# Patient Record
Sex: Female | Born: 2000 | Race: White | Hispanic: No | Marital: Single | Smoking: Never smoker
Health system: Southern US, Community
[De-identification: ages and names within clinical notes are randomized; demographics above are authoritative.]

## PROBLEM LIST (undated history)

## (undated) DIAGNOSIS — N83209 Unspecified ovarian cyst, unspecified side: Secondary | ICD-10-CM

## (undated) HISTORY — DX: Unspecified ovarian cyst, unspecified side: N83.209

---
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
## 2020-01-08 ENCOUNTER — Ambulatory Visit: Payer: Managed Care, Other (non HMO)

## 2020-01-10 ENCOUNTER — Other Ambulatory Visit: Payer: Self-pay

## 2020-01-10 ENCOUNTER — Telehealth: Payer: Self-pay | Admitting: Certified Nurse Midwife

## 2020-01-10 MED ORDER — MEDROXYPROGESTERONE ACETATE 150 MG/ML IM SUSP: 150.0000 mg | Freq: Once | INTRAMUSCULAR | 3 refills | Status: DC

## 2020-01-10 NOTE — Telephone Encounter (Signed)
Pt called in needs depo refill sent to [facility] in [place]. Please advise

## 2020-01-12 ENCOUNTER — Other Ambulatory Visit: Payer: Self-pay

## 2020-01-12 ENCOUNTER — Ambulatory Visit (INDEPENDENT_AMBULATORY_CARE_PROVIDER_SITE_OTHER): Payer: Managed Care, Other (non HMO) | Admitting: Certified Nurse Midwife

## 2020-01-12 DIAGNOSIS — Z3042 Encounter for surveillance of injectable contraceptive: Secondary | ICD-10-CM | POA: Diagnosis not present

## 2020-01-12 MED ORDER — MEDROXYPROGESTERONE ACETATE 150 MG/ML IM SUSP
150.0000 mg | Freq: Once | INTRAMUSCULAR | Status: AC
  Administered 2020-01-12: 150 mg via INTRAMUSCULAR

## 2020-01-12 NOTE — Progress Notes (Signed)
Date last pap: NA Last Depo-Provera: 10/16/2019 Side Effects if any: NA Serum HCG indicated? NA Depo-Provera 150 mg IM given by: [facility] CMA  Next appointment due July 16-July-30

## 2020-01-15 ENCOUNTER — Ambulatory Visit: Payer: Managed Care, Other (non HMO)

## 2020-01-19 ENCOUNTER — Ambulatory Visit: Payer: Managed Care, Other (non HMO)

## 2020-04-11 ENCOUNTER — Ambulatory Visit (INDEPENDENT_AMBULATORY_CARE_PROVIDER_SITE_OTHER): Payer: Managed Care, Other (non HMO)

## 2020-04-11 DIAGNOSIS — Z3042 Encounter for surveillance of injectable contraceptive: Secondary | ICD-10-CM

## 2020-04-11 MED ORDER — MEDROXYPROGESTERONE ACETATE 150 MG/ML IM SUSP
150.0000 mg | Freq: Once | INTRAMUSCULAR | Status: AC
  Administered 2020-04-11: 150 mg via INTRAMUSCULAR

## 2020-04-11 NOTE — Progress Notes (Signed)
Date last pap: underage. Last Depo-Provera: 01/12/2020 . Side Effects if any: none. Serum HCG indicated? n/a. Depo-Provera 150 mg IM given by: Jari Pigg, LPN Next appointment due Oct 14-28, 2021.

## 2020-05-28 ENCOUNTER — Encounter: Payer: Managed Care, Other (non HMO) | Admitting: Certified Nurse Midwife

## 2020-06-11 ENCOUNTER — Encounter: Payer: Managed Care, Other (non HMO) | Admitting: Certified Nurse Midwife

## 2020-06-15 ENCOUNTER — Encounter: Payer: Self-pay | Admitting: Emergency Medicine

## 2020-06-15 ENCOUNTER — Emergency Department
Admission: EM | Admit: 2020-06-15 | Discharge: 2020-06-15 | Disposition: A | Discharge status: Home / Self Care | Payer: Managed Care, Other (non HMO) | Attending: Emergency Medicine | Admitting: Emergency Medicine

## 2020-06-15 ENCOUNTER — Other Ambulatory Visit: Payer: Self-pay

## 2020-06-15 DIAGNOSIS — W460XXA Contact with hypodermic needle, initial encounter: Secondary | ICD-10-CM | POA: Diagnosis not present

## 2020-06-15 DIAGNOSIS — S61432A Puncture wound without foreign body of left hand, initial encounter: Secondary | ICD-10-CM | POA: Insufficient documentation

## 2020-06-15 LAB — BASIC METABOLIC PANEL
Anion gap: 11 (ref 5–15)
BUN: 10 mg/dL (ref 6–20)
CO2: 23 mmol/L (ref 22–32)
Calcium: 9.7 mg/dL (ref 8.9–10.3)
Chloride: 106 mmol/L (ref 98–111)
Creatinine, Ser: 0.79 mg/dL (ref 0.44–1.00)
GFR calc Af Amer: >60 mL/min (ref >60)
GFR calc non Af Amer: >60 mL/min (ref >60)
Glucose, Bld: 93 mg/dL (ref 70–99)
Potassium: 3.6 mmol/L (ref 3.5–5.1)
Sodium: 140 mmol/L (ref 135–145)

## 2020-06-15 LAB — POC URINE PREG, ED: Preg Test, Ur: NEGATIVE

## 2020-06-15 NOTE — ED Notes (Signed)
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

## 2020-06-15 NOTE — ED Notes (Addendum)
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

## 2020-06-15 NOTE — ED Triage Notes (Signed)
Pt here for needle stick.  Works at [employer] office and was stuck by needle that someone did not put a cap on.  Unsure which pt came from as was cleaning up at end of day.  Has cleaned with soap and water.  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

## 2020-06-15 NOTE — ED Provider Notes (Signed)
[facility] Emergency Department Provider Note  ____________________________________________   First MD Initiated Contact with Patient 06/15/20 2204     (approximate)  I have reviewed the triage vital signs and the nursing notes.   HISTORY  Chief Complaint Body Fluid Exposure   HPI Mackenzie Rice is a 19 y.o. female without significant past medical history who presents for assessment after an accidental occupational needlestick injury to her left second digit that occurred this afternoon.  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  She states it was immediately cleaned and there was no significant bleeding or blood on the needle.  Source patient is identified.  Patient denies any other acute symptoms or injuries.  No recent fevers, chills, cough, nausea, vomiting, diarrhea, dysuria, rash, extremity pain, vomiting, or other acute symptoms.  Patient states she thinks her hep B series is up-to-date but is not 100% sure.         History reviewed. No pertinent past medical history.  There are no problems to display for this patient.   History reviewed. No pertinent surgical history.  Prior to Admission medications   Medication Sig Start Date End Date Taking? Authorizing Provider  medroxyPROGESTERone (DEPO-PROVERA) 150 MG/ML injection Inject 1 mL (150 mg total) into the muscle once for 1 dose. 01/10/20 01/10/20  Doreene Burke, CNM    Allergies Patient has no known allergies.  Family History  Problem Relation Age of Onset   Cancer Paternal Aunt    Breast cancer Neg Hx    Ovarian cancer Neg Hx    Colon cancer Neg Hx     Social History Social History   Tobacco Use   Smoking status: Never Smoker   Smokeless tobacco: Never Used  Building services engineer Use: Never used  Substance Use Topics   Alcohol use: No   Drug use: No    Review of  Systems  Review of Systems  Constitutional: Negative for chills and fever.  HENT: Negative for sore throat.   Eyes: Negative for pain.  Respiratory: Negative for cough and stridor.   Cardiovascular: Negative for chest pain.  Gastrointestinal: Negative for vomiting.  Genitourinary: Negative for dysuria.  Musculoskeletal: Negative for myalgias.  Skin: Negative for rash.  Neurological: Negative for seizures, loss of consciousness and headaches.  Psychiatric/Behavioral: Negative for suicidal ideas.  All other systems reviewed and are negative.     ____________________________________________   PHYSICAL EXAM:  VITAL SIGNS: ED Triage Vitals  Enc Vitals Group     BP 06/15/20 1821 107/82     Pulse Rate 06/15/20 1821 100     Resp 06/15/20 1821 14     Temp 06/15/20 1821 98.5 F (36.9 C)     Temp Source 06/15/20 1821 Oral     SpO2 06/15/20 1821 100 %     Weight 06/15/20 1814 115 lb (52.2 kg)     Height 06/15/20 1814 5\' 6"  (1.676 m)     Head Circumference --      Peak Flow --      Pain Score 06/15/20 1814 0     Pain Loc --      Pain Edu? --      Excl. in GC? --    Vitals:   06/15/20 1821 06/15/20 2136  BP: 107/82 120/70  Pulse: 100 78  Resp: 14 15  Temp: 98.5 F (36.9 C) 98.3 F (  36.8 C)  SpO2: 100% 100%   Physical Exam Vitals and nursing note reviewed.  Constitutional:      General: She is not in acute distress.    Appearance: She is well-developed.  HENT:     Head: Normocephalic and atraumatic.     Right Ear: External ear normal.     Left Ear: External ear normal.     Nose: Nose normal.  Eyes:     Conjunctiva/sclera: Conjunctivae normal.  Cardiovascular:     Rate and Rhythm: Normal rate and regular rhythm.     Pulses: Normal pulses.     Heart sounds: No murmur heard.   Pulmonary:     Effort: Pulmonary effort is normal. No respiratory distress.  Abdominal:     Palpations: Abdomen is soft.     Tenderness: There is no abdominal tenderness.    Musculoskeletal:     Cervical back: Neck supple.  Skin:    General: Skin is warm and dry.     Capillary Refill: Capillary refill takes less than 2 seconds.  Neurological:     Mental Status: She is alert and oriented to person, place, and time.  Psychiatric:        Mood and Affect: Mood normal.     No puncture wound visualized in the left hand or any digit.  Patient is able to flex and extend all digits and left hand against resistance.  Sensation intact in the triage attribution of the radial ulnar and median nerves. ____________________________________________   LABS (all labs ordered are listed, but only abnormal results are displayed)  Labs Reviewed  BASIC METABOLIC PANEL  PREGNANCY, URINE  POC URINE PREG, ED   ____________________________________________  ____________________________________________   PROCEDURES  Procedure(s) performed (including Critical Care):  Procedures   ____________________________________________   INITIAL IMPRESSION / ASSESSMENT AND PLAN / ED COURSE        Patient presents with [place] to history exam after accidental Occupational needlestick injury.  Patient is afebrile hemodynamically stable arrival.  No significant injury noted on exam to finger reportedly injured.  HIV postexposure prophylaxis not indicated at this time given no source patient is identifiable.  We will send hepatitis panel.  Counseled patient to follow-up with her PCP and that she could get the results through her MyChart.  Discharge stable condition.  Strict return precautions advised and discussed.   ____________________________________________   FINAL CLINICAL IMPRESSION(S) / ED DIAGNOSES  Final diagnoses:  Accident caused by hypodermic needle, initial encounter    Medications - No data to display   ED Discharge Orders    None       Note:  This document was prepared using Dragon voice recognition software and may include unintentional dictation  errors.   Gilles Chiquito, MD 06/15/20 2312

## 2020-06-15 NOTE — ED Notes (Signed)
Lab called for add on 

## 2020-06-28 ENCOUNTER — Other Ambulatory Visit: Payer: Self-pay

## 2020-06-28 ENCOUNTER — Encounter: Payer: Self-pay | Admitting: Internal Medicine

## 2020-06-28 ENCOUNTER — Ambulatory Visit (INDEPENDENT_AMBULATORY_CARE_PROVIDER_SITE_OTHER): Payer: Managed Care, Other (non HMO) | Admitting: Internal Medicine

## 2020-06-28 VITALS — BP 116/72 | HR 74

## 2020-06-28 DIAGNOSIS — R6889 Other general symptoms and signs: Secondary | ICD-10-CM

## 2020-06-28 DIAGNOSIS — L819 Disorder of pigmentation, unspecified: Secondary | ICD-10-CM | POA: Diagnosis not present

## 2020-06-28 DIAGNOSIS — R238 Other skin changes: Secondary | ICD-10-CM

## 2020-06-28 DIAGNOSIS — R42 Dizziness and giddiness: Secondary | ICD-10-CM

## 2020-06-28 DIAGNOSIS — W460XXA Contact with hypodermic needle, initial encounter: Secondary | ICD-10-CM

## 2020-06-28 DIAGNOSIS — R233 Spontaneous ecchymoses: Secondary | ICD-10-CM

## 2020-06-28 NOTE — Progress Notes (Signed)
Subjective:    Patient ID: Mackenzie Rice, female    DOB: [birth year], 19 y.o.   MRN: [id number]  HPI  Pt presents to the clinic today for ER follow up. She went to the ER 10/2 after a needle stick injury of left index finger at work. She had cleaned the area immediately after the incident. Hep B, Hep C, and HIV were negative. She was discharged without intervention, advised to follow up with her PCP. Since discharge, she denies pain, redness, swelling or discharge.  She is concerned that she may be anemic. She reports intermittent dizziness if she stands up too quick or for too long, she reports cold intolerance and that her hands and feet turn blue, easy bruising and fatigue. She denies abnormal bleeding. Her mother has a history of thyroid disorder. She has no family history of Raynauds that she is aware of.   Review of Systems      No past medical history on file.  Current Outpatient Medications  Medication Sig Dispense Refill   medroxyPROGESTERone (DEPO-PROVERA) 150 MG/ML injection Inject 1 mL (150 mg total) into the muscle once for 1 dose. 1 mL 3   No current facility-administered medications for this visit.    No Known Allergies  Family History  Problem Relation Age of Onset   Cancer Paternal Aunt    Breast cancer Neg Hx    Ovarian cancer Neg Hx    Colon cancer Neg Hx     Social History   Socioeconomic History   Marital status: Single    Spouse name: Not on file   Number of children: Not on file   Years of education: Not on file   Highest education level: Not on file  Occupational History   Not on file  Tobacco Use   Smoking status: Never Smoker   Smokeless tobacco: Never Used  Vaping Use   Vaping Use: Never used  Substance and Sexual Activity   Alcohol use: No   Drug use: No   Sexual activity: Never    Birth control/protection: Injection  Other Topics Concern   Not on file  Social History Narrative   Not on file   Social  Determinants of Health   Financial Resource Strain:    Difficulty of Paying Living Expenses: Not on file  Food Insecurity:    Worried About Running Out of Food in the Last Year: Not on file   [employer] of Food in the Last Year: Not on file  Transportation Needs:    Lack of Transportation (Medical): Not on file   Lack of Transportation (Non-Medical): Not on file  Physical Activity:    Days of Exercise per Week: Not on file   Minutes of Exercise per Session: Not on file  Stress:    Feeling of Stress : Not on file  Social Connections:    Frequency of Communication with Friends and Family: Not on file   Frequency of Social Gatherings with Friends and Family: Not on file   Attends Religious Services: Not on file   Active Member of Clubs or Organizations: Not on file   Attends Banker Meetings: Not on file   Marital Status: Not on file  Intimate Partner Violence:    Fear of Current or Ex-Partner: Not on file   Emotionally Abused: Not on file   Physically Abused: Not on file   Sexually Abused: Not on file     Constitutional: Pt reports fatigue. Denies fever, malaise, headache or  abrupt weight changes.  Respiratory: Denies difficulty breathing, shortness of breath, cough or sputum production.   Cardiovascular: Pt reports blue discoloration of hands and feet. Denies chest pain, chest tightness, palpitations or swelling in the hands or feet.  Skin: Pt reports needlestick injury, index finger, lefthand, cold intolerance. Denies redness, rashes, lesions or ulcercations.  Neuro: Pt reports intermittent dizziness. Denies difficulty with speech, memory or problems with balance or coordination.    No other specific complaints in a complete review of systems (except as listed in HPI above).  Objective:   Physical Exam   BP 116/72    Pulse 74   Wt Readings from Last 3 Encounters:  06/15/20 115 lb (52.2 kg) (26 %, Z= -0.63)*  10/16/19 112 lb 1.6 oz (50.8 kg)  (23 %, Z= -0.73)*  07/31/19 116 lb 12.8 oz (53 kg) (34 %, Z= -0.41)*   * Growth percentiles are based on CDC (Girls, 2-20 Years) data.    General: Appears her stated age, well developed, well nourished in NAD. Skin: Warm, dry and intact. No bruising noted. Neck: No masses, lumps or thyromegaly noted. Cardiovascular: Normal rate and rhythm. S1,S2 noted.  No murmur, rubs or gallops noted.  Pulmonary/Chest: Normal effort and positive vesicular breath sounds. No respiratory distress. No wheezes, rales or ronchi noted.  Musculoskeletal: Normal flexion and extension of the index finger, left hand.  Neurological: Alert and oriented.    BMET    Component Value Date/Time   NA 140 06/15/2020 2206   K 3.6 06/15/2020 2206   CL 106 06/15/2020 2206   CO2 23 06/15/2020 2206   GLUCOSE 93 06/15/2020 2206   BUN 10 06/15/2020 2206   CREATININE 0.79 06/15/2020 2206   CALCIUM 9.7 06/15/2020 2206   GFRNONAA >60 06/15/2020 2206   GFRAA >60 06/15/2020 2206    Lipid Panel  No results found for: CHOL, TRIG, HDL, CHOLHDL, VLDL, LDLCALC  CBC    Component Value Date/Time   WBC 7.9 10/02/2018 1439   RBC 4.08 10/02/2018 1439   HGB 11.3 (L) 10/02/2018 1439   HCT 35.3 (L) 10/02/2018 1439   PLT 205 10/02/2018 1439   MCV 86.5 10/02/2018 1439   MCH 27.7 10/02/2018 1439   MCHC 32.0 10/02/2018 1439   RDW 12.9 10/02/2018 1439   LYMPHSABS 2.3 09/27/2017 1501   MONOABS 0.5 09/27/2017 1501   EOSABS 0.1 09/27/2017 1501   BASOSABS 0.0 09/27/2017 1501    Hgb A1C No results found for: HGBA1C         Assessment & Plan:   ER Follow Up for Needle Stick Injury:  ER notes and labs reviewed No other intervention needed at this time Will repeat acute Hep panel and HIV in 3 months, lab only.   Dizziness, Cold Intolerance, Discoloration of Hands and Toes, Easy Bruising:  Will check CBC, IBC panel, ferritin and TSH next week, lab only  Will follow up after labs, return precautions discussed Nicki Reaper, NP This visit occurred during the SARS-CoV-2 public health emergency.  Safety protocols were in place, including screening questions prior to the visit, additional usage of staff PPE, and extensive cleaning of exam room while observing appropriate contact time as indicated for disinfecting solutions.

## 2020-06-28 NOTE — Patient Instructions (Signed)
Anemia  Anemia is a condition in which you do not have enough red blood cells or hemoglobin. Hemoglobin is a substance in red blood cells that carries oxygen. When you do not have enough red blood cells or hemoglobin (are anemic), your body cannot get enough oxygen and your organs may not work properly. As a result, you may feel very tired or have other problems. What are the causes? Common causes of anemia include:  Excessive bleeding. Anemia can be caused by excessive bleeding inside or outside the body, including bleeding from the intestine or from periods in women.  Poor nutrition.  Long-lasting (chronic) kidney, thyroid, and liver disease.  Bone marrow disorders.  Cancer and treatments for cancer.  HIV (human immunodeficiency virus) and AIDS (acquired immunodeficiency syndrome).  Treatments for HIV and AIDS.  Spleen problems.  Blood disorders.  Infections, medicines, and autoimmune disorders that destroy red blood cells. What are the signs or symptoms? Symptoms of this condition include:  Minor weakness.  Dizziness.  Headache.  Feeling heartbeats that are irregular or faster than normal (palpitations).  Shortness of breath, especially with exercise.  Paleness.  Cold sensitivity.  Indigestion.  Nausea.  Difficulty sleeping.  Difficulty concentrating. Symptoms may occur suddenly or develop slowly. If your anemia is mild, you may not have symptoms. How is this diagnosed? This condition is diagnosed based on:  Blood tests.  Your medical history.  A physical exam.  Bone marrow biopsy. Your health care provider may also check your stool (feces) for blood and may do additional testing to look for the cause of your bleeding. You may also have other tests, including:  Imaging tests, such as a CT scan or MRI.  Endoscopy.  Colonoscopy. How is this treated? Treatment for this condition depends on the cause. If you continue to lose a lot of blood, you may  need to be treated at a hospital. Treatment may include:  Taking supplements of iron, vitamin S31, or folic acid.  Taking a hormone medicine (erythropoietin) that can help to stimulate red blood cell growth.  Having a blood transfusion. This may be needed if you lose a lot of blood.  Making changes to your diet.  Having surgery to remove your spleen. Follow these instructions at home:  Take over-the-counter and prescription medicines only as told by your health care provider.  Take supplements only as told by your health care provider.  Follow any diet instructions that you were given.  Keep all follow-up visits as told by your health care provider. This is important. Contact a health care provider if:  You develop new bleeding anywhere in the body. Get help right away if:  You are very weak.  You are short of breath.  You have pain in your abdomen or chest.  You are dizzy or feel faint.  You have trouble concentrating.  You have bloody or black, tarry stools.  You vomit repeatedly or you vomit up blood. Summary  Anemia is a condition in which you do not have enough red blood cells or enough of a substance in your red blood cells that carries oxygen (hemoglobin).  Symptoms may occur suddenly or develop slowly.  If your anemia is mild, you may not have symptoms.  This condition is diagnosed with blood tests as well as a medical history and physical exam. Other tests may be needed.  Treatment for this condition depends on the cause of the anemia. This information is not intended to replace advice given to you by  your health care provider. Make sure you discuss any questions you have with your health care provider. Document Revised: 08/13/2017 Document Reviewed: 10/02/2016 Elsevier Patient Education  [employer].

## 2020-07-02 ENCOUNTER — Ambulatory Visit: Payer: Managed Care, Other (non HMO)

## 2020-07-03 NOTE — Progress Notes (Signed)
Last depo inj: 04/11/20 UPT: N/A Side effects: none Next Depo- Provera injection due: 09/18/20-10/02/20 Annual exam due: 09/30/20

## 2020-07-04 ENCOUNTER — Ambulatory Visit (INDEPENDENT_AMBULATORY_CARE_PROVIDER_SITE_OTHER): Payer: Managed Care, Other (non HMO)

## 2020-07-04 ENCOUNTER — Other Ambulatory Visit: Payer: Self-pay

## 2020-07-04 DIAGNOSIS — Z3042 Encounter for surveillance of injectable contraceptive: Secondary | ICD-10-CM | POA: Diagnosis not present

## 2020-07-04 MED ORDER — MEDROXYPROGESTERONE ACETATE 150 MG/ML IM SUSP
150.0000 mg | Freq: Once | INTRAMUSCULAR | Status: AC
  Administered 2020-07-04: 150 mg via INTRAMUSCULAR

## 2020-07-05 ENCOUNTER — Encounter: Payer: Self-pay | Admitting: Internal Medicine

## 2020-07-05 ENCOUNTER — Other Ambulatory Visit (INDEPENDENT_AMBULATORY_CARE_PROVIDER_SITE_OTHER): Payer: Managed Care, Other (non HMO)

## 2020-07-05 DIAGNOSIS — R6889 Other general symptoms and signs: Secondary | ICD-10-CM

## 2020-07-05 DIAGNOSIS — R238 Other skin changes: Secondary | ICD-10-CM

## 2020-07-05 DIAGNOSIS — R42 Dizziness and giddiness: Secondary | ICD-10-CM

## 2020-07-05 DIAGNOSIS — L819 Disorder of pigmentation, unspecified: Secondary | ICD-10-CM

## 2020-07-05 DIAGNOSIS — R233 Spontaneous ecchymoses: Secondary | ICD-10-CM

## 2020-07-05 LAB — CBC
HCT: 39.3 % (ref 36.0–49.0)
Hemoglobin: 13.1 g/dL (ref 12.0–16.0)
MCHC: 33.4 g/dL (ref 31.0–37.0)
MCV: 86.4 fl (ref 78.0–98.0)
Platelets: 216 10*3/uL (ref 150.0–575.0)
RBC: 4.54 Mil/uL (ref 3.80–5.70)
RDW: 12.9 % (ref 11.4–15.5)
WBC: 7.6 10*3/uL (ref 4.5–13.5)

## 2020-07-05 LAB — TSH: TSH: 4.01 u[IU]/mL (ref 0.40–5.00)

## 2020-07-05 LAB — FERRITIN: Ferritin: 14.4 ng/mL (ref 10.0–291.0)

## 2020-07-05 LAB — IBC PANEL
Iron: 82 ug/dL (ref 42–145)
Saturation Ratios: 23.4 % (ref 20.0–50.0)
Transferrin: 250 mg/dL (ref 212.0–360.0)

## 2020-09-16 ENCOUNTER — Other Ambulatory Visit: Payer: Self-pay | Admitting: Internal Medicine

## 2020-09-16 DIAGNOSIS — W460XXD Contact with hypodermic needle, subsequent encounter: Secondary | ICD-10-CM

## 2020-09-30 ENCOUNTER — Encounter: Payer: Managed Care, Other (non HMO) | Admitting: Certified Nurse Midwife

## 2020-10-04 ENCOUNTER — Other Ambulatory Visit: Payer: Managed Care, Other (non HMO)

## 2020-11-11 ENCOUNTER — Telehealth: Payer: Self-pay | Admitting: Internal Medicine

## 2020-11-11 NOTE — Telephone Encounter (Signed)
Got her in 3/11 at 2:15

## 2020-11-11 NOTE — Telephone Encounter (Signed)
Pt called in she is having issues with her tonsils and wanted to know about getting a referral , and would like someone in [place]

## 2020-11-12 NOTE — Telephone Encounter (Signed)
[facility] Day - Client TELEPHONE ADVICE RECORD Access[employer]urse Patient Name: NOREEN MACKINTOSH Gender: Female DOB: 03/21/2001 Age: 20 Y 5 M 5 D Return Phone Number: [phone] (Primary) Address: [place] [address] Client [facility] Day - Client Client Site [facility] - Day Physician Nicki Reaper - NP Contact Type Call Who Is Calling Patient / Member / Family / Caregiver Call Type Triage / Clinical Relationship To Patient Self Return Phone Number [phone] (Primary) Chief Complaint Sore Throat Reason for Call Symptomatic / Request for Health Information Initial Comment Caller says that she has pain on left side of her tonsils, and it is hard to eat and swallow. She has tonsils stones. Translation No Nurse Assessment Nurse: Alexander Mt, RN, Nicholaus Bloom Date/Time (Eastern Time): 11/12/2020 2:50:26 PM Confirm and document reason for call. If symptomatic, describe symptoms. ---Caller says that she has pain on left side of her tonsils for a week now. States it is hard to eat and swallow because it burns. Left tonsil is swollen and red. No fever. Headache for the last 3 days. She has tonsils stones. Does the patient have any new or worsening symptoms? ---Yes Will a triage be completed? ---Yes Related visit to physician within the last 2 weeks? ---No Does the PT have any chronic conditions? (i.e. diabetes, asthma, this includes High risk factors for pregnancy, etc.) ---No Is the patient pregnant or possibly pregnant? (Ask all females between the ages of 6-55) ---No Is this a behavioral health or substance abuse call? ---No Guidelines Guideline Title Affirmed Question Affirmed Notes Nurse Date/Time Lamount Cohen Time) Sore Throat Earache also present Alexander Mt, RNNicholaus Bloom 11/12/2020 2:52:18 PM Disp. Time Lamount Cohen Time) Disposition Final User 11/12/2020 2:54:02 PM See PCP within 24 Hours Yes Alexander Mt, RN, Prentiss Bells  Disagree/Comply Comply Caller Understands Yes PLEASE NOTE: All timestamps contained within this report are represented as [place] Standard Time. CONFIDENTIALTY NOTICE: This fax transmission is intended only for the addressee. It contains information that is legally privileged, confidential or otherwise protected from use or disclosure. If you are not the intended recipient, you are strictly prohibited from reviewing, disclosing, copying using or disseminating any of this information or taking any action in reliance on or regarding this information. If you have received this fax in error, please notify us immediately by telephone so that we can arrange for its return to [place]. Phone: [phone], Toll-Free: [phone], Fax: [phone] Page: 2 of 2 Call Id: [id number] PreDisposition Did not know what to do Care Advice Given Per Guideline SEE PCP WITHIN 24 HOURS: * IF OFFICE WILL BE OPEN: You need to be examined within the next 24 hours. Call your doctor (or NP/PA) when the office opens and make an appointment. SORE THROAT: * Here are some simple things you can do to treat and reduce sore throat pain. * Sip warm chicken broth or apple juice. * Suck on hard candy or an over-the-counter throat lozenge. * Gargle with warm salt water four times a day. To make salt water, put 1/2 teaspoon of salt in 8 oz (240 ml) of warm water. * Avoid cigarette smoke. * IBUPROFEN (E.G., MOTRIN, ADVIL): Take 400 mg (two 200 mg pills) by mouth every 6 hours. The most you should take each day is 1,200 mg (six 200 mg pills), unless your doctor has told you to take more. * ACETAMINOPHEN REGULAR STRENGTH TYLENOL: Take 650 mg (two 325 mg pills) by mouth every 4-6 hours as needed. Each Regular Strength  Tylenol pill has 325 mg of acetaminophen. The most you should take each day is 3,250 mg (10 pills a day). PAIN AND FEVER MEDICINES: SOFT DIET: * Eat a soft diet. * Cold drinks, popsicles, and milk shakes are especially good. Avoid  citrus fruits. CALL BACK IF: * You become worse CARE ADVICE given per Sore Throat (Adult) guideline. Referrals REFERRED TO PCP OFFICE

## 2020-11-12 NOTE — Telephone Encounter (Signed)
I spoke with pt; pt said for 1 week has been pulling out couple of tonsil stones daily. Pt has S/T but can still swallow and no problems breathing.pt said she has H/A on and off for 3 days; today pt taking Tylenol and that has helped H/A but H/A starting to return now. Lt ear bothering her also. Pt said no one has dx her S/T or tonsil issues. Pt scheduled appt in office with Dr Reece Agar on 11/13/20 at 10 AM and was Oked by Dr Reece Agar. Pt denies fever, cough or other covid symptoms. UC & ED precautions given and pt voiced understanding. Shawna Orleans CMA said to leave appt already scheduled with R Baity NP on 11/22/20 at this time. Sending note to Dr Nila Nephew CMA and Pamala Hurry NP.

## 2020-11-12 NOTE — Telephone Encounter (Signed)
Patient can't eat or swallow. Patient is asking for some advice or help until her appt.  EM

## 2020-11-13 ENCOUNTER — Encounter: Payer: Self-pay | Admitting: Internal Medicine

## 2020-11-13 ENCOUNTER — Ambulatory Visit: Payer: Managed Care, Other (non HMO) | Admitting: Family Medicine

## 2020-11-13 ENCOUNTER — Other Ambulatory Visit: Payer: Self-pay

## 2020-11-13 ENCOUNTER — Encounter: Payer: Self-pay | Admitting: Family Medicine

## 2020-11-13 VITALS — BP 118/80 | HR 105 | Temp 98.2°F | Ht 66.0 in | Wt 105.2 lb

## 2020-11-13 DIAGNOSIS — J020 Streptococcal pharyngitis: Secondary | ICD-10-CM | POA: Diagnosis not present

## 2020-11-13 DIAGNOSIS — R519 Headache, unspecified: Secondary | ICD-10-CM

## 2020-11-13 DIAGNOSIS — J358 Other chronic diseases of tonsils and adenoids: Secondary | ICD-10-CM

## 2020-11-13 DIAGNOSIS — G43109 Migraine with aura, not intractable, without status migrainosus: Secondary | ICD-10-CM

## 2020-11-13 DIAGNOSIS — J029 Acute pharyngitis, unspecified: Secondary | ICD-10-CM

## 2020-11-13 LAB — POCT RAPID STREP A (OFFICE): Rapid Strep A Screen: POSITIVE — AB

## 2020-11-13 MED ORDER — AMOXICILLIN 500 MG PO CAPS: 500.0000 mg | ORAL_CAPSULE | Freq: Two times a day (BID) | ORAL | 0 refills | Status: DC

## 2020-11-13 NOTE — Assessment & Plan Note (Addendum)
Unclear etiology at this time. Not like her typical migraines. ?strep throat vs other viral pharyngitis. RST.  Supportive care reviewed.

## 2020-11-13 NOTE — Progress Notes (Signed)
Patient ID: Mackenzie Rice, female    DOB: 06-10-2001, 20 y.o.   MRN: [id number]  This visit was conducted in person.  BP 118/80   Pulse (!) 105   Temp 98.2 F (36.8 C) (Temporal)   Ht 5\' 6"  (1.676 m)   Wt 105 lb 3 oz (47.7 kg)   SpO2 99%   BMI 16.98 kg/m    CC: sore throat  Subjective:   HPI: Mackenzie Rice is a 20 y.o. female presenting on 11/13/2020 for Sore Throat (C/o ST and HA.  Sxs started about 1 wk ago.  H/o tonsil issues, including tonsil stones.  Also, h/o migraines.  Reports yesterday, had some numbing/tingling in lip and left fingers. )   1.5 wk h/o sore throat associated with sharp L sided headache and behind eye. Localizes discomfort to L tonsil associated with L earache. Worst headache yesterday after work, today HA persists but improved (6/10). Yesterday had episode of numbness to L lip and nose as well as left hand numbness. Some L facial pain. Mild nausea from headache.  Treating with tylenol, advil, excedrin without much benefit.   No fevers/chills, hearing changes, recent swimming, drainage from ears, tooth pain, abd pain, diarrhea, cough, congestion. No loss of taste or smell. No skin rash. No body aches or mylgias. No increased fatigue noted. No trismus.  No known sick contacts.  No h/o mono, denies possible exposure.   H/o typical migraines with aura - this feels different.   Has been told has enlarged tonsils.  H/o tonsilloliths, has removed 2 recently.   Has not received COVID vaccine.  Works at [employer].      Relevant past medical, surgical, family and social history reviewed and updated as indicated. Interim medical history since our last visit reviewed. Allergies and medications reviewed and updated. Outpatient Medications Prior to Visit  Medication Sig Dispense Refill  . medroxyPROGESTERone (DEPO-PROVERA) 150 MG/ML injection Inject 1 mL (150 mg total) into the muscle once for 1 dose. 1 mL 3   No facility-administered medications prior to  visit.     Per HPI unless specifically indicated in ROS section below Review of Systems Objective:  BP 118/80   Pulse (!) 105   Temp 98.2 F (36.8 C) (Temporal)   Ht 5\' 6"  (1.676 m)   Wt 105 lb 3 oz (47.7 kg)   SpO2 99%   BMI 16.98 kg/m   Wt Readings from Last 3 Encounters:  11/13/20 105 lb 3 oz (47.7 kg) (9 %, Z= -1.36)*  06/15/20 115 lb (52.2 kg) (26 %, Z= -0.63)*  10/16/19 112 lb 1.6 oz (50.8 kg) (23 %, Z= -0.73)*   * Growth percentiles are based on CDC (Girls, 2-20 Years) data.      Physical Exam Vitals and nursing note reviewed.  Constitutional:      General: She is not in acute distress.    Appearance: Normal appearance. She is well-developed and well-nourished. She is not ill-appearing.  HENT:     Head: Normocephalic and atraumatic.     Right Ear: Hearing, tympanic membrane, ear canal and external ear normal.     Left Ear: Hearing, tympanic membrane, ear canal and external ear normal.     Nose: Nose normal. No mucosal edema, congestion or rhinorrhea.     Right Sinus: No maxillary sinus tenderness or frontal sinus tenderness.     Left Sinus: No maxillary sinus tenderness or frontal sinus tenderness.     Mouth/Throat:     Mouth:  Mucous membranes are normal. Mucous membranes are moist.     Pharynx: Oropharynx is clear. Uvula midline. Posterior oropharyngeal erythema (mild to posterior oropharynx) present. No oropharyngeal exudate or posterior oropharyngeal edema.     Tonsils: No tonsillar exudate or tonsillar abscesses.     Comments: No tonsilloliths appreciated Eyes:     General: No scleral icterus.    Extraocular Movements: Extraocular movements intact and EOM normal.     Conjunctiva/sclera: Conjunctivae normal.     Pupils: Pupils are equal, round, and reactive to light.  Neck:     Thyroid: No thyroid mass or thyromegaly.  Cardiovascular:     Rate and Rhythm: Normal rate and regular rhythm.     Pulses: Normal pulses and intact distal pulses.     Heart sounds:  Normal heart sounds. No murmur heard.   Pulmonary:     Effort: Pulmonary effort is normal. No respiratory distress.     Breath sounds: Normal breath sounds. No wheezing, rhonchi or rales.  Musculoskeletal:     Cervical back: Normal range of motion and neck supple.  Lymphadenopathy:     Head:     Right side of head: No submental, submandibular, tonsillar, preauricular or posterior auricular adenopathy.     Left side of head: No submental, submandibular, tonsillar, preauricular or posterior auricular adenopathy.     Cervical: No cervical adenopathy.     Right cervical: No superficial or deep cervical adenopathy.    Left cervical: No superficial or deep cervical adenopathy.  Skin:    General: Skin is warm and dry.     Findings: No rash.  Neurological:     Mental Status: She is alert.  Psychiatric:        Mood and Affect: Mood normal.        Behavior: Behavior normal.       Results for orders placed or performed in visit on 11/13/20  POCT rapid strep A  Result Value Ref Range   Rapid Strep A Screen Positive (A) Negative   Assessment & Plan:  This visit occurred during the SARS-CoV-2 public health emergency.  Safety protocols were in place, including screening questions prior to the visit, additional usage of staff PPE, and extensive cleaning of exam room while observing appropriate contact time as indicated for disinfecting solutions.   Problem List Items Addressed This Visit    Strep throat - Primary    Strep returned positive - Rx amoxicillin 500mg  BID x 10 days.  Supportive care as per instructions.      Relevant Orders   POCT rapid strep A (Completed)   Sore throat    Benign exam. Check RST.  Support provided.  Brings picture from yesterday showing enlarged swollen erythematous L tonsil as well as enlarged uvula.  Will cover for tonsillitis with amoxicillin course.  Update if not improving with treatment.       Nonintractable episodic headache    Unclear etiology at  this time. Not like her typical migraines. ?strep throat vs other viral pharyngitis. RST.  Supportive care reviewed.       Migraine with aura    H/o this, not recently.          Meds ordered this encounter  Medications  . amoxicillin (AMOXIL) 500 MG capsule    Sig: Take 1 capsule (500 mg total) by mouth 2 (two) times daily.    Dispense:  20 capsule    Refill:  0   Orders Placed This Encounter  Procedures  .  POCT rapid strep A    Patient instructions: You have streptococcal pharyngitis (strep throat). Push fluids and plenty of rest. May use ibuprofen 600mg  with meals for throat inflammation. Salt water gargles. Suck on cold things like popsicles or warm things like herbal teas (whichever soothes the throat better). Return if fever >101.5, worsening pain, or trouble opening/closing mouth, or hoarse voice. Good to see you today, call clinic with questions.   Follow up plan: Return if symptoms worsen or fail to improve.  , MD

## 2020-11-13 NOTE — Assessment & Plan Note (Signed)
Strep returned positive - Rx amoxicillin 500mg  BID x 10 days.  Supportive care as per instructions.

## 2020-11-13 NOTE — Assessment & Plan Note (Addendum)
Benign exam. Check RST.  Support provided.  Brings picture from yesterday showing enlarged swollen erythematous L tonsil as well as enlarged uvula.  Will cover for tonsillitis with amoxicillin course.  Update if not improving with treatment.

## 2020-11-13 NOTE — Telephone Encounter (Signed)
noted 

## 2020-11-13 NOTE — Patient Instructions (Signed)
You have streptococcal pharyngitis (strep throat). Push fluids and plenty of rest. May use ibuprofen 600mg  with meals for throat inflammation. Salt water gargles. Suck on cold things like popsicles or warm things like herbal teas (whichever soothes the throat better). Return if fever >101.5, worsening pain, or trouble opening/closing mouth, or hoarse voice. Good to see you today, call clinic with questions.   Strep Throat, Adult Strep throat is an infection in the throat that is caused by bacteria. It is common during the cold months of the year. It mostly affects children who are 8-55 years old. However, people of all ages can get it at any time of the year. This infection spreads from person to person (is contagious) through coughing, sneezing, or having close contact. Your health care provider may use other names to describe the infection. It can be called tonsillitis (if there is swelling of the tonsils), or pharyngitis (if there is swelling at the back of the throat). What are the causes? This condition is caused by the Streptococcus pyogenes bacteria. What increases the risk? You are more likely to develop this condition if:  You care for school-age children, or are around school-age children. Children are more likely to get strep throat and may spread it to others.  You spend time in crowded places where the infection can spread easily.  You have close contact with someone who has strep throat. What are the signs or symptoms? Symptoms of this condition include:  Fever or chills.  Redness, swelling, or pain in the tonsils or throat.  Pain or difficulty when swallowing.  White or yellow spots on the tonsils or throat.  Tender glands in the neck and under the jaw.  Bad smelling breath.  Red rash all over the body. This is rare. How is this diagnosed? This condition is diagnosed by tests that check for the presence and the amount of bacteria that cause strep throat. They  are:  Rapid strep test. Your throat is swabbed and checked for the presence of bacteria. Results are usually ready in minutes.  Throat culture test. Your throat is swabbed. The sample is placed in a cup that allows infections to grow. Results are usually ready in 1 or 2 days.   How is this treated? This condition may be treated with:  Medicines that kill germs (antibiotics).  Medicines that relieve pain or fever. These include: ? Ibuprofen or acetaminophen. ? Aspirin, only for patients who are over the age of 36. ? Throat lozenges. ? Throat sprays. Follow these instructions at home: Medicines  Take over-the-counter and prescription medicines only as told by your health care provider.  Take your antibiotic medicine as told by your health care provider. Do not stop taking the antibiotic even if you start to feel better.   Eating and drinking  If you have trouble swallowing, try eating soft foods until your sore throat feels better.  Drink enough fluid to keep your urine pale yellow.  To help relieve pain, you may have: ? Warm fluids, such as soup and tea. ? Cold fluids, such as frozen desserts or popsicles.   General instructions  Gargle with a salt-water mixture 3-4 times a day or as needed. To make a salt-water mixture, completely dissolve -1 tsp (3-6 g) of salt in 1 cup (237 mL) of warm water.  Get plenty of rest.  Stay home from work or school until you have been taking antibiotics for 24 hours.  Avoid smoking or being around people who  smoke.  Keep all follow-up visits as told by your health care provider. This is important. How is this prevented?  Do not share food, drinking cups, or personal items that could cause the infection to spread to other people.  Wash your hands well with soap and water, and make sure that all people in your house wash their hands well.  Have family members tested if they have a sore throat or fever. They may need an antibiotic if they  have strep throat.   Contact a health care provider if:  The glands in your neck continue to get bigger.  You develop a rash, cough, or earache.  You cough up a thick mucus that is green, yellow-brown, or bloody.  You have pain or discomfort that does not get better with medicine.  Your symptoms seem to be getting worse and not better.  You have a fever. Get help right away if:  You have new symptoms, such as vomiting, severe headache, stiff or painful neck, chest pain, or shortness of breath.  You have severe throat pain, drooling, or changes in your voice.  You have swelling of the neck, or the skin on the neck becomes red and tender.  You have signs of dehydration, such as tiredness (fatigue), dry mouth, and decreased urination.  You become increasingly sleepy, or you cannot wake up completely.  Your joints become red or painful. Summary  Strep throat is an infection in the throat that is caused by the Streptococcus pyogenes bacteria. This infection is spread from person to person (is contagious) through coughing, sneezing, or having close contact.  Take your medicines, including antibiotics, as told by your health care provider. Do not stop taking the antibiotic even if you start to feel better.  To prevent the spread of germs, wash your hands well with soap and water. Have others do the same. Do not share food, drinking cups, or personal items.  Get help right away if you have new symptoms, such as vomiting, severe headache, stiff or painful neck, chest pain, or shortness of breath. This information is not intended to replace advice given to you by your health care provider. Make sure you discuss any questions you have with your health care provider. Document Revised: 11/18/2018 Document Reviewed: 11/18/2018 Elsevier Patient Education  2021 [employer].

## 2020-11-13 NOTE — Assessment & Plan Note (Signed)
H/o this, not recently.

## 2020-11-14 ENCOUNTER — Ambulatory Visit: Payer: Managed Care, Other (non HMO) | Admitting: Internal Medicine

## 2020-11-22 ENCOUNTER — Ambulatory Visit: Payer: Managed Care, Other (non HMO) | Admitting: Internal Medicine

## 2021-03-10 ENCOUNTER — Telehealth: Payer: Self-pay | Admitting: Internal Medicine

## 2021-03-10 DIAGNOSIS — W460XXA Contact with hypodermic needle, initial encounter: Secondary | ICD-10-CM

## 2021-03-10 NOTE — Addendum Note (Signed)
Addended by: Eustaquio Boyden on: 03/10/2021 02:07 PM   Modules accepted: Orders

## 2021-03-10 NOTE — Telephone Encounter (Signed)
Ok to do - I have ordered labs.  
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
## 2021-03-12 ENCOUNTER — Other Ambulatory Visit: Payer: Self-pay

## 2021-03-12 ENCOUNTER — Other Ambulatory Visit (INDEPENDENT_AMBULATORY_CARE_PROVIDER_SITE_OTHER): Payer: Managed Care, Other (non HMO)

## 2021-03-12 DIAGNOSIS — W460XXA Contact with hypodermic needle, initial encounter: Secondary | ICD-10-CM

## 2021-03-12 DIAGNOSIS — W460XXD Contact with hypodermic needle, subsequent encounter: Secondary | ICD-10-CM | POA: Diagnosis not present

## 2021-03-12 DIAGNOSIS — S61231D Puncture wound without foreign body of left index finger without damage to nail, subsequent encounter: Secondary | ICD-10-CM | POA: Diagnosis not present

## 2021-03-12 NOTE — Telephone Encounter (Signed)
Lvm asking pt to call back.  Need to schedule lab visit. (See Dr. G's msg below). 

## 2021-03-13 LAB — HEPATITIS PANEL, ACUTE
Hep A IgM: NONREACTIVE
Hep B C IgM: NONREACTIVE
Hepatitis B Surface Ag: NONREACTIVE
Hepatitis C Ab: NONREACTIVE
SIGNAL TO CUT-OFF: 0.01 (ref <1.00)

## 2021-03-13 LAB — HIV ANTIBODY (ROUTINE TESTING W REFLEX): HIV 1&2 Ab, 4th Generation: NONREACTIVE

## 2021-03-13 NOTE — Telephone Encounter (Signed)
Lvm asking pt to call back.  Need to schedule lab visit. (See Dr. G's msg below). 

## 2021-03-18 NOTE — Telephone Encounter (Signed)
Lvm asking pt to call back.  Need to schedule lab visit. (See Dr. Timoteo Expose msg below).

## 2021-03-20 NOTE — Telephone Encounter (Signed)
Mailing a letter.  

## 2021-05-05 ENCOUNTER — Encounter: Payer: Self-pay | Admitting: Certified Nurse Midwife

## 2021-05-05 ENCOUNTER — Encounter: Payer: Managed Care, Other (non HMO) | Admitting: Certified Nurse Midwife

## 2021-06-04 ENCOUNTER — Emergency Department: Payer: Managed Care, Other (non HMO)

## 2021-06-04 ENCOUNTER — Emergency Department
Admission: EM | Admit: 2021-06-04 | Discharge: 2021-06-04 | Disposition: A | Discharge status: Home / Self Care | Payer: Managed Care, Other (non HMO) | Attending: Emergency Medicine | Admitting: Emergency Medicine

## 2021-06-04 ENCOUNTER — Encounter: Payer: Self-pay | Admitting: Emergency Medicine

## 2021-06-04 ENCOUNTER — Telehealth: Payer: Self-pay

## 2021-06-04 ENCOUNTER — Encounter: Payer: Self-pay | Admitting: Certified Nurse Midwife

## 2021-06-04 ENCOUNTER — Other Ambulatory Visit: Payer: Self-pay

## 2021-06-04 ENCOUNTER — Other Ambulatory Visit (HOSPITAL_COMMUNITY)
Admission: RE | Admit: 2021-06-04 | Discharge: 2021-06-04 | Disposition: A | Discharge status: Home / Self Care | Payer: Managed Care, Other (non HMO) | Source: Ambulatory Visit | Attending: Certified Nurse Midwife | Admitting: Certified Nurse Midwife

## 2021-06-04 ENCOUNTER — Ambulatory Visit: Payer: Managed Care, Other (non HMO) | Admitting: Certified Nurse Midwife

## 2021-06-04 VITALS — BP 119/85 | HR 111 | Ht 65.5 in | Wt 103.5 lb

## 2021-06-04 DIAGNOSIS — R509 Fever, unspecified: Secondary | ICD-10-CM | POA: Diagnosis not present

## 2021-06-04 DIAGNOSIS — N939 Abnormal uterine and vaginal bleeding, unspecified: Secondary | ICD-10-CM

## 2021-06-04 DIAGNOSIS — R1084 Generalized abdominal pain: Secondary | ICD-10-CM | POA: Diagnosis present

## 2021-06-04 DIAGNOSIS — Z113 Encounter for screening for infections with a predominantly sexual mode of transmission: Secondary | ICD-10-CM | POA: Insufficient documentation

## 2021-06-04 DIAGNOSIS — R1031 Right lower quadrant pain: Secondary | ICD-10-CM | POA: Insufficient documentation

## 2021-06-04 DIAGNOSIS — R197 Diarrhea, unspecified: Secondary | ICD-10-CM | POA: Diagnosis not present

## 2021-06-04 DIAGNOSIS — R112 Nausea with vomiting, unspecified: Secondary | ICD-10-CM | POA: Insufficient documentation

## 2021-06-04 LAB — URINALYSIS, COMPLETE (UACMP) WITH MICROSCOPIC
Bacteria, UA: NONE SEEN
Bilirubin Urine: NEGATIVE
Glucose, UA: NEGATIVE mg/dL
Ketones, ur: NEGATIVE mg/dL
Leukocytes,Ua: NEGATIVE
Nitrite: NEGATIVE
Protein, ur: NEGATIVE mg/dL
Specific Gravity, Urine: 1.019 (ref 1.005–1.030)
pH: 6 (ref 5.0–8.0)

## 2021-06-04 LAB — COMPREHENSIVE METABOLIC PANEL
ALT: 17 U/L (ref 0–44)
AST: 18 U/L (ref 15–41)
Albumin: 4.5 g/dL (ref 3.5–5.0)
Alkaline Phosphatase: 49 U/L (ref 38–126)
Anion gap: 7 (ref 5–15)
BUN: 7 mg/dL (ref 6–20)
CO2: 27 mmol/L (ref 22–32)
Calcium: 9.2 mg/dL (ref 8.9–10.3)
Chloride: 103 mmol/L (ref 98–111)
Creatinine, Ser: 0.65 mg/dL (ref 0.44–1.00)
GFR, Estimated: >60 mL/min (ref >60)
Glucose, Bld: 98 mg/dL (ref 70–99)
Potassium: 3.7 mmol/L (ref 3.5–5.1)
Sodium: 137 mmol/L (ref 135–145)
Total Bilirubin: 0.7 mg/dL (ref 0.3–1.2)
Total Protein: 6.9 g/dL (ref 6.5–8.1)

## 2021-06-04 LAB — LIPASE, BLOOD: Lipase: 38 U/L (ref 11–51)

## 2021-06-04 LAB — CBC
HCT: 36.5 % (ref 36.0–46.0)
Hemoglobin: 12.4 g/dL (ref 12.0–15.0)
MCH: 30 pg (ref 26.0–34.0)
MCHC: 34 g/dL (ref 30.0–36.0)
MCV: 88.4 fL (ref 80.0–100.0)
Platelets: 218 10*3/uL (ref 150–400)
RBC: 4.13 MIL/uL (ref 3.87–5.11)
RDW: 12.4 % (ref 11.5–15.5)
WBC: 7.9 10*3/uL (ref 4.0–10.5)
nRBC: 0 % (ref 0.0–0.2)

## 2021-06-04 LAB — POC URINE PREG, ED: Preg Test, Ur: NEGATIVE

## 2021-06-04 MED ORDER — IBUPROFEN 600 MG PO TABS: 600.0000 mg | ORAL_TABLET | Freq: Three times a day (TID) | ORAL | 0 refills | Status: DC | PRN

## 2021-06-04 MED ORDER — ONDANSETRON 4 MG PO TBDP: 4.0000 mg | ORAL_TABLET | Freq: Three times a day (TID) | ORAL | 0 refills | Status: DC | PRN

## 2021-06-04 MED ORDER — NORETHINDRONE ACETATE 5 MG PO TABS: 10.0000 mg | ORAL_TABLET | Freq: Every day | ORAL | 0 refills | Status: DC

## 2021-06-04 MED ORDER — DICYCLOMINE HCL 20 MG PO TABS: 20.0000 mg | ORAL_TABLET | Freq: Three times a day (TID) | ORAL | 0 refills | Status: DC | PRN

## 2021-06-04 MED ORDER — ONDANSETRON 4 MG PO TBDP
4.0000 mg | ORAL_TABLET | Freq: Once | ORAL | Status: AC | PRN
  Administered 2021-06-04: 4 mg via ORAL
  Filled 2021-06-04: qty 1

## 2021-06-04 MED ORDER — OXYCODONE-ACETAMINOPHEN 5-325 MG PO TABS
1.0000 | ORAL_TABLET | Freq: Once | ORAL | Status: AC
  Administered 2021-06-04: 1 via ORAL
  Filled 2021-06-04: qty 1

## 2021-06-04 NOTE — ED Notes (Signed)
MD at bedside. 

## 2021-06-04 NOTE — ED Provider Notes (Signed)
Emergency Medicine Provider Triage Evaluation Note  Mackenzie Rice , a 20 y.o. female  was evaluated in triage.  Pt complains of RLQ pain. She was sent from her GYN office for acute RLQ pain with associated NVD. She also endorses fevers today. She notes onset a few hours prior to arrival.   Review of Systems  Positive: RLQ abdominal pain, NVD Negative: CP  Physical Exam  BP (!) 127/58 (BP Location: Left Arm)   Pulse (!) 108   Temp 99 F (37.2 C) (Oral)   Resp 18   Ht 5\' 5"  (1.651 m)   Wt 46.9 kg   SpO2 100%   BMI 17.21 kg/m  Gen:   Awake, no distress  NAD Resp:  Normal effort CTA MSK:   Moves extremities without difficulty  Other:  ABD: RLQ and right flank tenderness  Medical Decision Making  Medically screening exam initiated at 4:03 PM.  Appropriate orders placed.  was informed that the remainder of the evaluation will be completed by another provider, this initial triage assessment does not replace that evaluation, and the importance of remaining in the ED until their evaluation is complete.  Patient with ED evaluation of RLQ abdominal pain.   Arnetha Massy, PA-C 06/04/21 1607    06/06/21, MD 06/04/21 [phone]

## 2021-06-04 NOTE — Progress Notes (Signed)
GYN ENCOUNTER NOTE  Subjective:       Mackenzie Rice is a 20 y.o. G0P0000 female is here for gynecologic evaluation of the following issues:  1. Abnormal uterine bleeding, pt state she stopped her depo last year due to mood changes. She was having irregular periods but over the past 2.5 months has been bleeding constantly.She is changing her tampon q 1 hr. She denies clots.  2 She also notes cramping and pain after intercourse.    3. She has lower right quadrant pain with a history of ovarian cysts.  4. Is now sexually active and is requesting STD screening  Gynecologic History Patient's last menstrual period was 04/20/2021. Contraception: condoms Last Pap: n/a.  Last mammogram: n/a   The pregnancy intention screening data noted above was reviewed. Potential methods of contraception were discussed. The patient elected to proceed with condoms.   Obstetric History OB History  Gravida Para Term Preterm AB Living  0 0 0 0 0 0  SAB IAB Ectopic Multiple Live Births  0 0 0 0 0    Past Medical History:  Diagnosis Date   Ovarian cyst     History reviewed. No pertinent surgical history.  No current outpatient medications on file prior to visit.   No current facility-administered medications on file prior to visit.    No Known Allergies  Social History   Socioeconomic History   Marital status: Single    Spouse name: Not on file   Number of children: Not on file   Years of education: Not on file   Highest education level: Not on file  Occupational History   Not on file  Tobacco Use   Smoking status: Never   Smokeless tobacco: Never  Vaping Use   Vaping Use: Never used  Substance and Sexual Activity   Alcohol use: No   Drug use: No   Sexual activity: Yes    Birth control/protection: Condom  Other Topics Concern   Not on file  Social History Narrative   Not on file   Social Determinants of Health   Financial Resource Strain: Not on file  Food Insecurity: Not on  file  Transportation Needs: Not on file  Physical Activity: Not on file  Stress: Not on file  Social Connections: Not on file  Intimate Partner Violence: Not on file    Family History  Problem Relation Age of Onset   Appendicitis Father    Cancer Paternal Aunt    Breast cancer Neg Hx    Ovarian cancer Neg Hx    Colon cancer Neg Hx     The following portions of the patient's history were reviewed and updated as appropriate: allergies, current medications, past family history, past medical history, past social history, past surgical history and problem list.  Review of Systems Review of Systems - Negative except as mentioned in HPI Review of Systems - General ROS: negative for - chills, fatigue, fever, hot flashes, malaise or night sweats Hematological and Lymphatic ROS: negative for - bleeding problems or swollen lymph nodes Gastrointestinal ROS: negative for - abdominal pain, blood in stools, change in bowel habits and nausea/vomiting Musculoskeletal ROS: negative for - joint pain, muscle pain or muscular weakness Genito-Urinary ROS: negative for -  dysmenorrhea, dyspareunia, dysuria, genital discharge, genital ulcers, hematuria, incontinence, irregular/heavy menses, nocturia or pelvic pain. Positive for change in menstrual cycle, lower right quadrant pain  Objective:   BP 119/85   Pulse (!) 111   Ht 5' 5.5" (1.664 m)  Wt 103 lb 8 oz (46.9 kg)   LMP 04/20/2021   BMI 16.96 kg/m  CONSTITUTIONAL: Well-developed, well-nourished female in no acute distress.  HENT:  Normocephalic, atraumatic.  NECK: Normal range of motion, supple, no masses.  Normal thyroid.  SKIN: Skin is warm and dry. No rash noted. Not diaphoretic. No erythema. No pallor. NEUROLGIC: Alert and oriented to person, place, and time. PSYCHIATRIC: Normal mood and affect. Normal behavior. Normal judgment and thought content. CARDIOVASCULAR:Not Examined RESPIRATORY: Not Examined BREASTS: Not Examined ABDOMEN: Soft,  non distended; Non tender.  No Organomegaly. PELVIC: pt is bleeding declines pelvic exam MUSCULOSKELETAL: Normal range of motion. No tenderness.  No cyanosis, clubbing, or edema.   Assessment:   1. Screening examination for STD (sexually transmitted disease) - Hepatitis B surface antigen - HIV Antibody (routine testing w rflx) - HSV(herpes simplex vrs) 1+2 ab-IgG - RPR - Hepatitis C Antibody - Cervicovaginal ancillary only  2. Abnormal uterine bleeding - US PELVIC COMPLETE WITH TRANSVAGINAL; Future     Plan:   Discussed use of medications to manage bleeding. She declines use of birth control due to side effects. Discussed use of progestin and lysteda. She would like to try the progestin . Discussed u/s to evaluate for possible cause of bleeding as well as evaluation for right lower quadrant pain. STD testing today. Will follow up with results. Aygestin 10 mg x 10 days ordered for bleeding   Face to face time 15 min.   Doreene Burke, CNM

## 2021-06-04 NOTE — Telephone Encounter (Signed)
[facility] [employer] Day - Client TELEPHONE ADVICE RECORD [facility] Patient Name: Mackenzie Rice Gender: Female DOB: 10/21/2000 Age: 20 Y 11 M 28 D Return Phone Number: [phone] (Primary) Address: City/ State/ Zip: [place] [address] Client [facility] Day - Client Client Site [facility] - Day Physician Audria Nine- NP Contact Type Call Who Is Calling Patient / Member / Family / Caregiver Call Type Triage / Clinical Relationship To Patient Self Return Phone Number [phone] (Primary) Chief Complaint Abdominal Pain Reason for Call Symptomatic / Request for Health Information Initial Comment Caller states she is calling on behalf of a pt who has symptoms of appendicitis. Additional Comment The caller is from the clinic. The pt had disconnected during the transfer and will need a call back. Translation No Nurse Assessment Nurse: Stefano Gaul, RN, Dwana Curd Date/Time (Eastern Time): 06/04/2021 12:38:54 PM Confirm and document reason for call. If symptomatic, describe symptoms. ---Caller states she has right sided abd pain. Pain is constant. saw GYN today who thought pain was not due to ovary. pain level 5-9. Has had diarrhea for about a week and half. Having hot sweats yesterday. Temp 98.8. Does the patient have any new or worsening symptoms? ---Yes Will a triage be completed? ---Yes Related visit to physician within the last 2 weeks? ---Yes Does the PT have any chronic conditions? (i.e. diabetes, asthma, this includes High risk factors for pregnancy, etc.) ---No Is the patient pregnant or possibly pregnant? (Ask all females between the ages of 66-55) ---No Is this a behavioral health or substance abuse call? ---No Guidelines Guideline Title Affirmed Question Affirmed Notes Nurse Date/Time (Eastern Time) Abdominal Pain - Female [1] MILDMODERATE pain AND [2] constant AND [3] present >  2 hours Stefano Gaul, RN, Dwana Curd 06/04/2021 12:41:48 PM PLEASE NOTE: All timestamps contained within this report are represented as [place] Standard Time. CONFIDENTIALTY NOTICE: This fax transmission is intended only for the addressee. It contains information that is legally privileged, confidential or otherwise protected from use or disclosure. If you are not the intended recipient, you are strictly prohibited from reviewing, disclosing, copying using or disseminating any of this information or taking any action in reliance on or regarding this information. If you have received this fax in error, please notify us immediately by telephone so that we can arrange for its return to [place]. Phone: [phone], Toll-Free: [phone], Fax: [phone] Page: 2 of 2 Call Id: [id number] Disp. Time Lamount Cohen Time) Disposition Final User 06/04/2021 12:48:43 PM See HCP within 4 Hours (or PCP triage) Yes Stefano Gaul, RN, Clerance Lav Disagree/Comply Disagree Caller Understands Yes PreDisposition Call Doctor Care Advice Given Per Guideline SEE HCP (OR PCP TRIAGE) WITHIN 4 HOURS: * IF OFFICE WILL BE OPEN: You need to be seen within the next 3 or 4 hours. Call your doctor (or NP/PA) now or as soon as the office opens. CALL BACK IF: * You become worse CARE ADVICE given per Abdominal Pain, Female (Adult) guideline. NOTHING BY MOUTH: * Do not eat or drink anything for now. Comments User: Art Buff, RN Date/Time Lamount Cohen Time): 06/04/2021 12:48:26 PM pt states that there are no appts for today. Does not want to go to urgent care or ER but has appt tomorrow at 10:30 and will keep that. Will go to the ER if pain gets worse. Referrals GO TO FACILITY REFUSED

## 2021-06-04 NOTE — Patient Instructions (Signed)
Abnormal Uterine Bleeding Abnormal uterine bleeding means bleeding more than usual from your womb (uterus). It can include: Bleeding between menstrual periods. Bleeding after sex. Bleeding that is heavier than normal. Menstrual periods that last longer than usual. Bleeding after you have stopped having your menstrual period (menopause). There are many problems that may cause this. You should see a doctor for any kind of bleeding that is not normal. Treatment depends on the cause of the bleeding. Follow these instructions at home: Medicines Take over-the-counter and prescription medicines only as told by your doctor. Tell your doctor about other medicines that you take. If told by your doctor, stop taking aspirin or medicines that have aspirin in them. These medicines can make you bleed more. You may be given iron pills to replace iron that your body loses because of this condition. Take them as told by your doctor. Managing constipation If you are taking iron pills, you may have trouble pooping (constipation). To prevent or treat trouble pooping, you may need to: Drink enough fluid to keep your pee (urine) pale yellow. Take over-the-counter or prescription medicines. Eat foods that are high in fiber. These include beans, whole grains, and fresh fruits and vegetables. Limit foods that are high in fat and sugar. These include fried or sweet foods. General instructions Watch your condition for any changes. Do not use tampons, douche, or have sex, if your doctor tells you not to. Change your pads often. Get regular exams. This includes pelvic exams and cervical cancer screenings. It is up to you to get the results of any tests that are done. Ask your doctor, or the department that is doing the tests, when your results will be ready. Keep all follow-up visits as told by your doctor. This is important. Contact a doctor if: The bleeding lasts more than 1 week. You feel dizzy at times. You feel  like you may vomit (nausea). You vomit. You feel light-headed or weak. Your symptoms get worse. Get help right away if: You pass out. You have to change pads every hour. You have pain in your belly. You have a fever or chills. You get sweaty. You get weak. You pass large blood clots from your vagina. Summary Abnormal uterine bleeding means bleeding more than usual from your womb (uterus). Any kind of bleeding that is not normal should be checked by a doctor. Treatment depends on the cause of the bleeding. Get help right away if you pass out, you have to change pads every hour, or you pass large blood clots from your vagina. This information is not intended to replace advice given to you by your health care provider. Make sure you discuss any questions you have with your health care provider. Document Revised: 07/04/2019 Document Reviewed: 07/04/2019 Elsevier Patient Education  2022 Elsevier Inc.  
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
## 2021-06-04 NOTE — ED Triage Notes (Signed)
Pt comes into the ED via POV c/o RLQ abd pain that started today.  Pt states she still has her appendix and gallbladder and a h/o ovarian cysts.  Pt also admits to nausea and some diarrhea.  Pt in NAD at this time with even and unlabored respirations.

## 2021-06-04 NOTE — Telephone Encounter (Signed)
Noted. Thanks for the thorough triage and I agree with your assessment.

## 2021-06-05 ENCOUNTER — Ambulatory Visit: Payer: Managed Care, Other (non HMO) | Admitting: Nurse Practitioner

## 2021-06-05 LAB — RPR: RPR Ser Ql: NONREACTIVE

## 2021-06-05 LAB — CERVICOVAGINAL ANCILLARY ONLY
Bacterial Vaginitis (gardnerella): NEGATIVE
Candida Glabrata: NEGATIVE
Candida Vaginitis: NEGATIVE
Chlamydia: NEGATIVE
Comment: NEGATIVE
Comment: NEGATIVE
Comment: NEGATIVE
Comment: NEGATIVE
Comment: NEGATIVE
Comment: NORMAL
Neisseria Gonorrhea: NEGATIVE
Trichomonas: NEGATIVE

## 2021-06-05 LAB — HSV(HERPES SIMPLEX VRS) I + II AB-IGG
HSV 1 Glycoprotein G Ab, IgG: <0.91 index (ref 0.00–0.90)
HSV 2 IgG, Type Spec: <0.91 index (ref 0.00–0.90)

## 2021-06-05 LAB — HEPATITIS C ANTIBODY: Hep C Virus Ab: <0.1 s/co ratio (ref 0.0–0.9)

## 2021-06-05 LAB — HEPATITIS B SURFACE ANTIGEN: Hepatitis B Surface Ag: NEGATIVE

## 2021-06-05 LAB — HIV ANTIBODY (ROUTINE TESTING W REFLEX): HIV Screen 4th Generation wRfx: NONREACTIVE

## 2021-06-18 ENCOUNTER — Other Ambulatory Visit: Payer: Managed Care, Other (non HMO)

## 2021-06-25 ENCOUNTER — Other Ambulatory Visit: Payer: Managed Care, Other (non HMO)

## 2021-06-26 ENCOUNTER — Other Ambulatory Visit: Payer: Managed Care, Other (non HMO)

## 2021-06-27 ENCOUNTER — Other Ambulatory Visit: Payer: Managed Care, Other (non HMO)

## 2021-07-04 ENCOUNTER — Other Ambulatory Visit: Payer: Managed Care, Other (non HMO)

## 2021-07-25 NOTE — ED Provider Notes (Signed)
[facility] Emergency Department Provider Note  ____________________________________________   Event Date/Time   First MD Initiated Contact with Patient 06/04/21 1657     (approximate)  I have reviewed the triage vital signs and the nursing notes.   HISTORY  Chief Complaint Abdominal Pain    HPI Mackenzie Rice is a 20 y.o. female  sent from GYN office. Pt reports acute onset of severe RLQ pain this morning, with associated nausea, vomiting and diarrhea. Pt has also had low grade fever and chills. Pt reports that she has had vaginal bleeding for several months, for which she has been seen and followed by her OBGYN. Over the past week, she's had associated nonbloody diarrhea and nausea. Pain just began more acutely in RLQ. She was seen by her OB today for her vaginal bleeding, mentioned the pain and was told to come to the ED. No specific alleviating or aggravating factors.       Past Medical History:  Diagnosis Date   Ovarian cyst     Patient Active Problem List   Diagnosis Date Noted   Nonintractable episodic headache 11/13/2020   Sore throat 11/13/2020   Migraine with aura 11/13/2020   Strep throat 11/13/2020    History reviewed. No pertinent surgical history.  Prior to Admission medications   Medication Sig Start Date End Date Taking? Authorizing Provider  dicyclomine (BENTYL) 20 MG tablet Take 1 tablet (20 mg total) by mouth 3 (three) times daily as needed for spasms. 06/04/21  Yes Shaune Pollack, MD  ibuprofen (ADVIL) 600 MG tablet Take 1 tablet (600 mg total) by mouth every 8 (eight) hours as needed for moderate pain. 06/04/21  Yes Shaune Pollack, MD  ondansetron (ZOFRAN ODT) 4 MG disintegrating tablet Take 1 tablet (4 mg total) by mouth every 8 (eight) hours as needed for nausea or vomiting. 06/04/21  Yes Shaune Pollack, MD  norethindrone (AYGESTIN) 5 MG tablet Take 2 tablets (10 mg total) by mouth daily for 10 days. 06/04/21 06/14/21   Doreene Burke, CNM    Allergies Patient has no known allergies.  Family History  Problem Relation Age of Onset   Appendicitis Father    Cancer Paternal Aunt    Breast cancer Neg Hx    Ovarian cancer Neg Hx    Colon cancer Neg Hx     Social History Social History   Tobacco Use   Smoking status: Never   Smokeless tobacco: Never  Vaping Use   Vaping Use: Never used  Substance Use Topics   Alcohol use: No   Drug use: No    Review of Systems  Review of Systems  Constitutional:  Negative for chills and fever.  HENT:  Negative for sore throat.   Respiratory:  Negative for shortness of breath.   Cardiovascular:  Negative for chest pain.  Gastrointestinal:  Positive for abdominal pain, diarrhea and nausea.  Genitourinary:  Positive for vaginal bleeding. Negative for flank pain.  Musculoskeletal:  Negative for neck pain.  Skin:  Negative for rash and wound.  Allergic/Immunologic: Negative for immunocompromised state.  Neurological:  Negative for weakness and numbness.  Hematological:  Does not bruise/bleed easily.    ____________________________________________  PHYSICAL EXAM:      VITAL SIGNS: ED Triage Vitals  Enc Vitals Group     BP 06/04/21 1545 (!) 127/58     Pulse Rate 06/04/21 1545 (!) 108     Resp 06/04/21 1545 18     Temp 06/04/21 1545 99 F (37.2  C)     Temp Source 06/04/21 1545 Oral     SpO2 06/04/21 1545 100 %     Weight 06/04/21 1548 103 lb 6.3 oz (46.9 kg)     Height 06/04/21 1548 5\' 5"  (1.651 m)     Head Circumference --      Peak Flow --      Pain Score 06/04/21 1548 9     Pain Loc --      Pain Edu? --      Excl. in GC? --      Physical Exam Vitals and nursing note reviewed.  Constitutional:      General: She is not in acute distress.    Appearance: She is well-developed.  HENT:     Head: Normocephalic and atraumatic.  Eyes:     Conjunctiva/sclera: Conjunctivae normal.  Cardiovascular:     Rate and Rhythm: Normal rate and regular  rhythm.     Heart sounds: Normal heart sounds.  Pulmonary:     Effort: Pulmonary effort is normal. No respiratory distress.     Breath sounds: No wheezing.  Abdominal:     General: There is no distension.     Tenderness: There is abdominal tenderness in the right lower quadrant, periumbilical area and suprapubic area.  Musculoskeletal:     Cervical back: Neck supple.  Skin:    General: Skin is warm.     Capillary Refill: Capillary refill takes less than 2 seconds.     Findings: No rash.  Neurological:     Mental Status: She is alert and oriented to person, place, and time.     Motor: No abnormal muscle tone.      ____________________________________________   LABS (all labs ordered are listed, but only abnormal results are displayed)  Labs Reviewed  URINALYSIS, COMPLETE (UACMP) WITH MICROSCOPIC - Abnormal; Notable for the following components:      Result Value   Color, Urine YELLOW (*)    APPearance CLEAR (*)    Hgb urine dipstick MODERATE (*)    All other components within normal limits  LIPASE, BLOOD  COMPREHENSIVE METABOLIC PANEL  CBC  POC URINE PREG, ED    ____________________________________________  EKG:  ________________________________________  RADIOLOGY All imaging, including plain films, CT scans, and ultrasounds, independently reviewed by me, and interpretations confirmed via formal radiology reads.  ED MD interpretation:   CT A/P: No acute intra-abd pathology  Official radiology report(s): No results found.  ____________________________________________  PROCEDURES   Procedure(s) performed (including Critical Care):  Procedures  ____________________________________________  INITIAL IMPRESSION / MDM / ASSESSMENT AND PLAN / ED COURSE  As part of my medical decision making, I reviewed the following data within the electronic MEDICAL RECORD NUMBER Nursing notes reviewed and incorporated, Old chart reviewed, Notes from prior ED visits, and [place]  Controlled Substance Database       *Astryd Pearcy was evaluated in Emergency Department on 07/25/2021 for the symptoms described in the history of present illness. She was evaluated in the context of the global COVID-19 pandemic, which necessitated consideration that the patient might be at risk for infection with the SARS-CoV-2 virus that causes COVID-19. Institutional protocols and algorithms that pertain to the evaluation of patients at risk for COVID-19 are in a state of rapid change based on information released by regulatory bodies including the CDC and federal and state organizations. These policies and algorithms were followed during the patient's care in the ED.  Some ED evaluations and interventions may be delayed  as a result of limited staffing during the pandemic.*     Medical Decision Making:  20 yo F with no significant PMHx here with intermittent cramp like abd pain, nausea, RLQ pain. Sent from [facility] for evaluation. Pt seen in triage, had labs, imaging sent and reviewed as above. CBC shows no leukocytosis. CMP unremarkable. UA negative for UTI. UPT negative. CT scan reviewed, shows no acute abnormality. No signs of ovarian cyst or other abnormality. Unlikely torsion based on absence of any secondary changes on CT, resolution of pain, and associated Gi sx.   Suspect viral GI illness, will treat symptomatically and d/c with supportive care, good return precautions.  ____________________________________________  FINAL CLINICAL IMPRESSION(S) / ED DIAGNOSES  Final diagnoses:  Generalized abdominal pain  Nausea vomiting and diarrhea     MEDICATIONS GIVEN DURING THIS VISIT:  Medications  ondansetron (ZOFRAN-ODT) disintegrating tablet 4 mg (4 mg Oral Given 06/04/21 1552)  oxyCODONE-acetaminophen (PERCOCET/ROXICET) 5-325 MG per tablet 1 tablet (1 tablet Oral Given 06/04/21 1552)     ED Discharge Orders          Ordered    ondansetron (ZOFRAN ODT) 4 MG disintegrating tablet   Every 8 hours PRN        06/04/21 1734    dicyclomine (BENTYL) 20 MG tablet  3 times daily PRN        06/04/21 1734    ibuprofen (ADVIL) 600 MG tablet  Every 8 hours PRN        06/04/21 1734             Note:  This document was prepared using Dragon voice recognition software and may include unintentional dictation errors.   Shaune Pollack, MD 07/25/21 2201

## 2022-04-08 ENCOUNTER — Ambulatory Visit
Admission: RE | Admit: 2022-04-08 | Discharge: 2022-04-08 | Disposition: A | Discharge status: Home / Self Care | Payer: Managed Care, Other (non HMO) | Source: Ambulatory Visit | Attending: Family Medicine | Admitting: Family Medicine

## 2022-04-08 VITALS — BP 108/78 | HR 99 | Temp 98.7°F | Resp 18

## 2022-04-08 DIAGNOSIS — Z20822 Contact with and (suspected) exposure to covid-19: Secondary | ICD-10-CM | POA: Insufficient documentation

## 2022-04-08 DIAGNOSIS — J029 Acute pharyngitis, unspecified: Secondary | ICD-10-CM | POA: Diagnosis present

## 2022-04-08 LAB — POCT RAPID STREP A (OFFICE): Rapid Strep A Screen: NEGATIVE

## 2022-04-08 MED ORDER — AZITHROMYCIN 250 MG PO TABS: ORAL_TABLET | ORAL | 0 refills | Status: DC

## 2022-04-08 NOTE — ED Triage Notes (Signed)
Pt presents with complaints of headache and sore throat x 2 days. Recent travel to [employer]. Pt friend that was with her is also sick. Denies fever.

## 2022-04-08 NOTE — ED Provider Notes (Addendum)
Roderic Palau    CSN: [id number] Arrival date & time: 04/08/22  [id number]      History   Chief Complaint Chief Complaint  Patient presents with   Sore Throat    Headache sore throat 4 days - Entered by patient   Appointment    HPI Mackenzie Rice is a 21 y.o. female.   HPI Patient with a history of strep and recurrent migraine headaches presents today with a 4-day history of sore throat and headache.   Reports recent travel with a friend who had been sick with similar symptoms. 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  Past Medical History:  Diagnosis Date   Ovarian cyst     Patient Active Problem List   Diagnosis Date Noted   Nonintractable episodic headache 11/13/2020   Sore throat 11/13/2020   Migraine with aura 11/13/2020   Strep throat 11/13/2020    History reviewed. No pertinent surgical history.  OB History     Gravida  0   Para  0   Term  0   Preterm  0   AB  0   Living  0      SAB  0   IAB  0   Ectopic  0   Multiple  0   Live Births  0            Home Medications    Prior to Admission medications   Medication Sig Start Date End Date Taking? Authorizing Provider  azithromycin (ZITHROMAX) 250 MG tablet Take 2 tabs PO x 1 dose, then 1 tab PO QD x 4 days 04/08/22  Yes Scot Jun, FNP  dicyclomine (BENTYL) 20 MG tablet Take 1 tablet (20 mg total) by mouth 3 (three) times daily as needed for spasms. 06/04/21   Duffy Bruce, MD  ibuprofen (ADVIL) 600 MG tablet Take 1 tablet (600 mg total) by mouth every 8 (eight) hours as needed for moderate pain. 06/04/21   Duffy Bruce, MD  norethindrone (AYGESTIN) 5 MG tablet Take 2 tablets (10 mg total) by mouth daily for 10 days. 06/04/21 06/14/21  Philip Aspen, CNM  ondansetron (ZOFRAN ODT) 4 MG disintegrating tablet Take 1 tablet (4 mg total) by mouth every 8 (eight) hours as needed for nausea or  vomiting. 06/04/21   Duffy Bruce, MD    Family History Family History  Problem Relation Age of Onset   Appendicitis Father    Cancer Paternal Aunt    Breast cancer Neg Hx    Ovarian cancer Neg Hx    Colon cancer Neg Hx     Social History Social History   Tobacco Use   Smoking status: Never   Smokeless tobacco: Never  Vaping Use   Vaping Use: Never used  Substance Use Topics   Alcohol use: No   Drug use: No     Allergies   Patient has no known allergies.   Review of Systems Review of Systems Pertinent negatives listed in HPI   Physical Exam Triage Vital Signs ED Triage Vitals  Enc Vitals Group     BP 04/08/22 0820 108/78     Pulse Rate 04/08/22 0820 99     Resp 04/08/22 0820 18     Temp 04/08/22 0820 98.7 F (37.1 C)     Temp src --      SpO2 04/08/22 0820 99 %  Weight --      Height --      Head Circumference --      Peak Flow --      Pain Score 04/08/22 0818 6     Pain Loc --      Pain Edu? --      Excl. in [place]? --    No data found.  Updated Vital Signs BP 108/78   Pulse 99   Temp 98.7 F (37.1 C)   Resp 18   LMP 03/27/2022   SpO2 99%   Visual Acuity Right Eye Distance:   Left Eye Distance:   Bilateral Distance:    Right Eye Near:   Left Eye Near:    Bilateral Near:     Physical Exam Vitals reviewed.  Constitutional:      Appearance: She is well-developed.  HENT:     Head: Normocephalic and atraumatic.     Right Ear: Tympanic membrane and ear canal normal.     Left Ear: Tympanic membrane and ear canal normal.     Mouth/Throat:     Pharynx: Pharyngeal swelling, posterior oropharyngeal erythema and uvula swelling present. No oropharyngeal exudate.     Tonsils: 3+ on the right. 3+ on the left.  Cardiovascular:     Rate and Rhythm: Normal rate and regular rhythm.  Pulmonary:     Effort: Pulmonary effort is normal.     Breath sounds: Normal breath sounds.  Skin:    General: Skin is warm and dry.     Capillary Refill:  Capillary refill takes less than 2 seconds.  Neurological:     General: No focal deficit present.     Mental Status: She is alert and oriented to person, place, and time.  Psychiatric:        Mood and Affect: Mood normal.        Behavior: Behavior normal.      UC Treatments / Results  Labs (all labs ordered are listed, but only abnormal results are displayed) Labs Reviewed  CULTURE, GROUP A STREP [facility])  POCT RAPID STREP A (OFFICE)    EKG   Radiology No results found.  Procedures Procedures (including critical care time)  Medications Ordered in UC Medications - No data to display  Initial Impression / Assessment and Plan / UC Course  I have reviewed the triage vital signs and the nursing notes.  Pertinent labs & imaging results that were available during my care of the patient were reviewed by me and considered in my medical decision making (see chart for details).    Acute Pharyngitis, Appearance of throat and patient history of recurrent strep is concerning for possible evolving strep infection. Will cover with Azithromycin while awaiting throat culture results.  If culture is negative,  discontinue antibiotics.   Patient later advised that the friend that she recently traveled with tested positive for COVID this morning.  Patient would like to go home and take an at home COVID test to avoid the cost of our test here.  If negative she will obtain a PCR test.  Advised to defer pick up azithromycin until COVID has been ruled out. Final Clinical Impressions(s) / UC Diagnoses   Final diagnoses:  Acute pharyngitis, unspecified etiology  Close exposure to COVID-19 virus     Discharge Instructions      I treating presumptively for strep with Azithromycin. I am ordering a throat culture if no bacterial growth is present discontinue use.     ED Prescriptions  Medication Sig Dispense Auth. Provider   azithromycin (ZITHROMAX) 250 MG tablet Take 2 tabs PO x 1  dose, then 1 tab PO QD x 4 days 6 tablet Bing Neighbors, FNP      PDMP not reviewed this encounter.   Bing Neighbors, FNP 04/08/22 0841    Bing Neighbors, FNP 04/08/22 1017

## 2022-04-08 NOTE — Discharge Instructions (Signed)
I treating presumptively for strep with Azithromycin. I am ordering a throat culture if no bacterial growth is present discontinue use.

## 2022-04-10 LAB — CULTURE, GROUP A STREP (THRC)

## 2022-04-20 IMAGING — CT CT ABD-PELV W/O CM
2 of 4 series · 16 of 46 positions shown, 18 images · non-contrast
Comparison: September 27, 2017

CLINICAL DATA: Right lower quadrant pain.

EXAM:
CT ABDOMEN AND PELVIS WITHOUT CONTRAST
TECHNIQUE: Multidetector CT imaging of the abdomen and pelvis was performed
following the standard protocol without IV contrast.

[Series 2: routine abd/pel wo · axial · 0.68mm/px · z∈[-741,-351]mm · 13 of 86 slices shown, 15 images]
[im 4/86  soft-tissue]
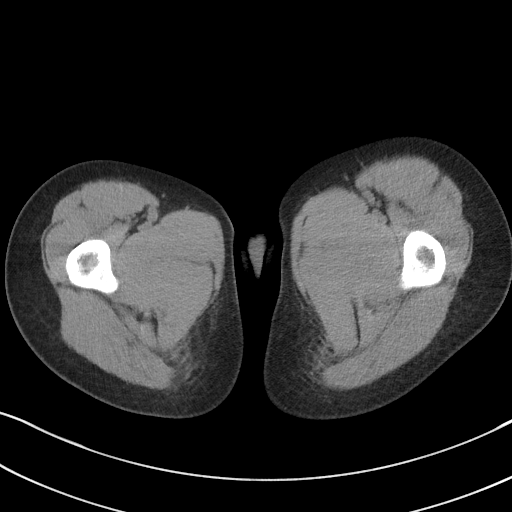
[im 4/86  bone]
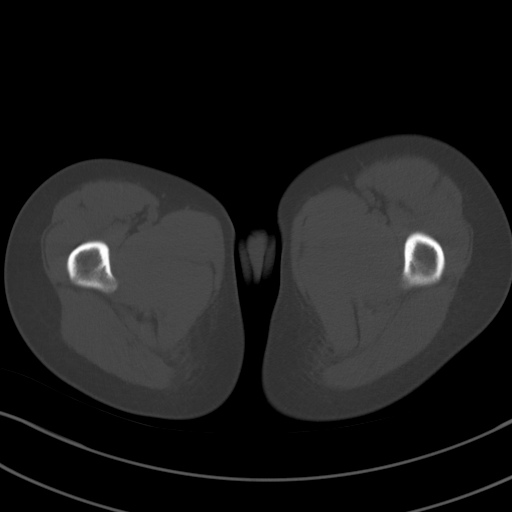
[im 11/86  soft-tissue]
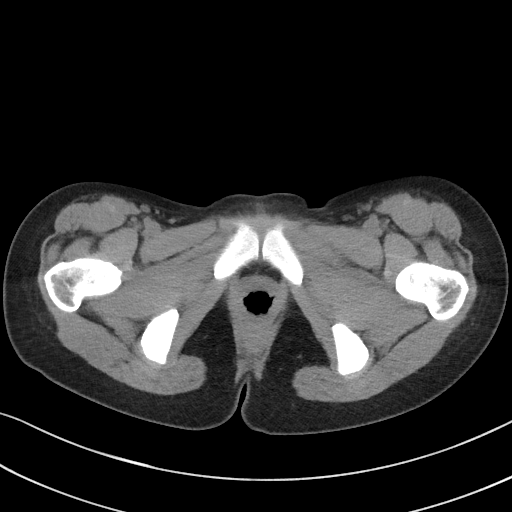
[im 18/86  soft-tissue]
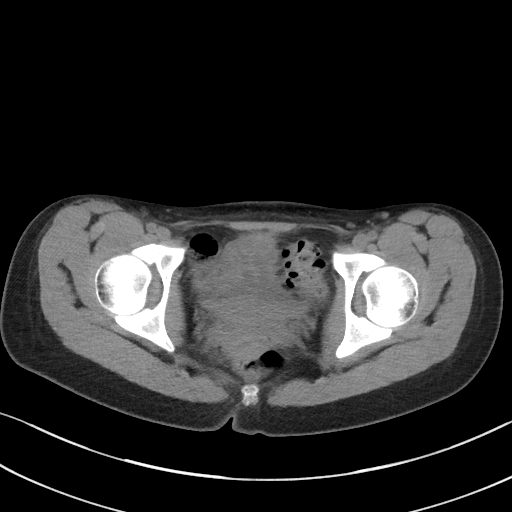
[im 24/86  soft-tissue]
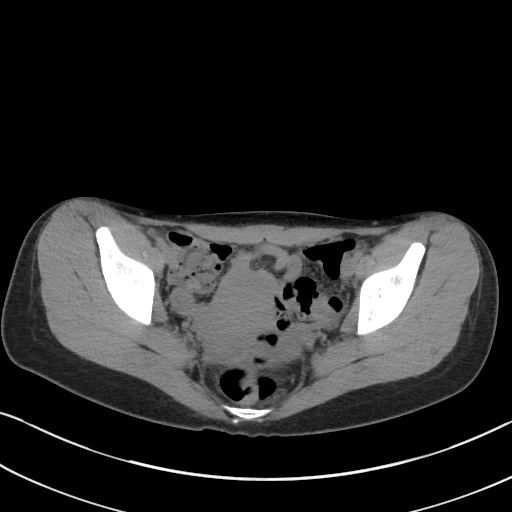
[im 31/86  soft-tissue]
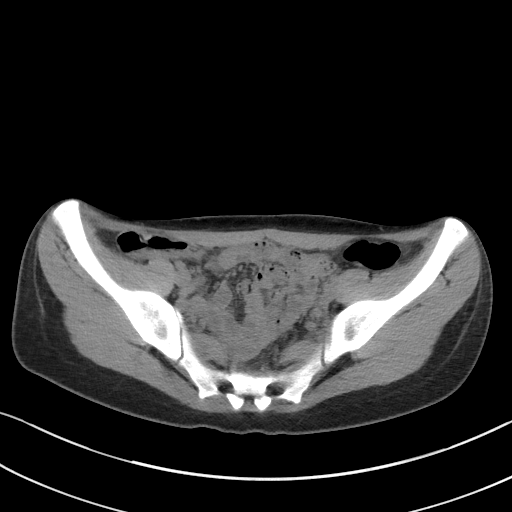
[im 38/86  soft-tissue]
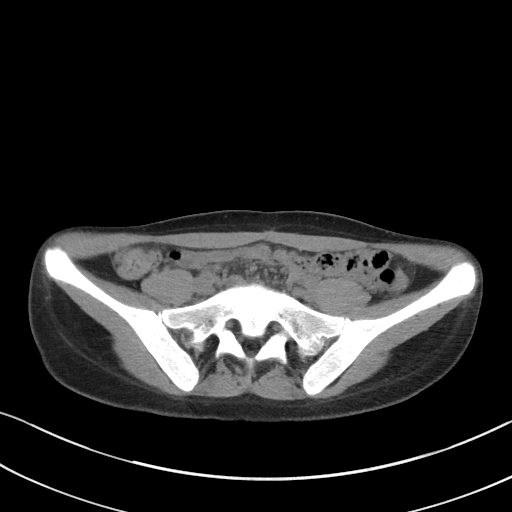
[im 45/86  soft-tissue]
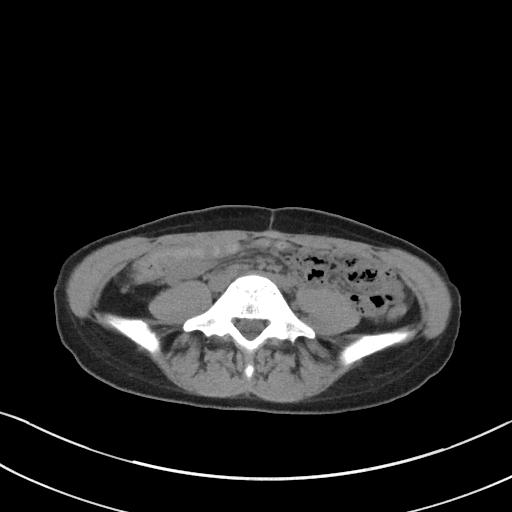
[im 48/86  soft-tissue]
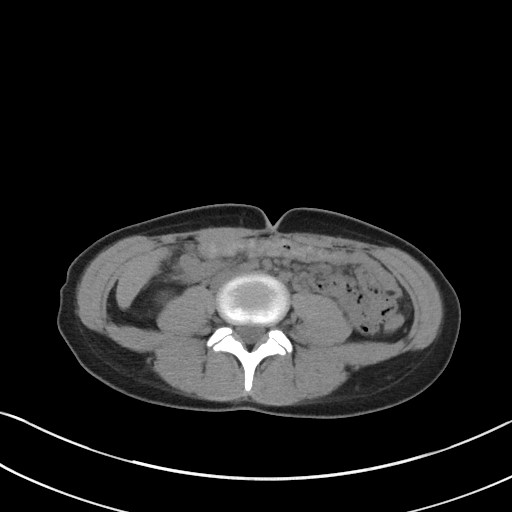
[im 55/86  soft-tissue]
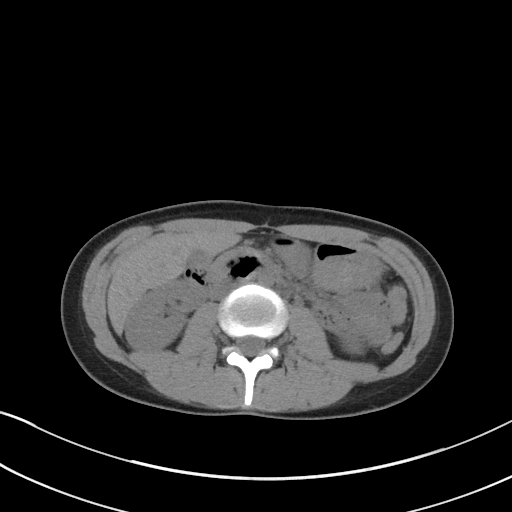
[im 55/86  bone]
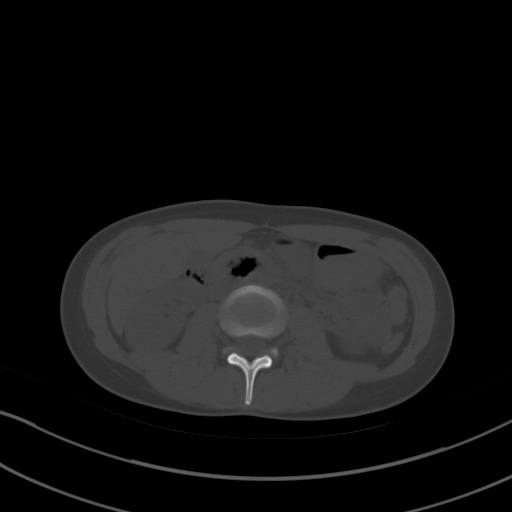
[im 62/86  soft-tissue]
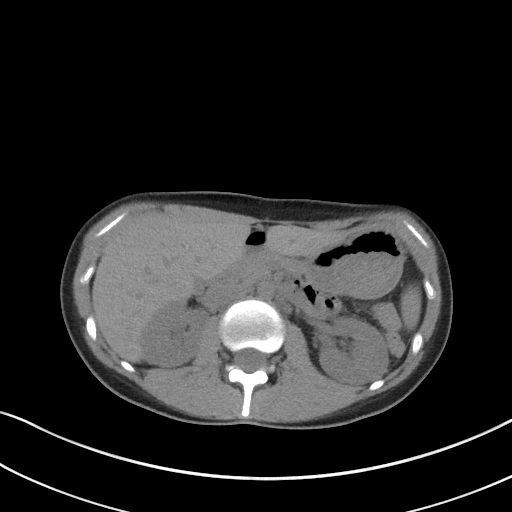
[im 69/86  soft-tissue]
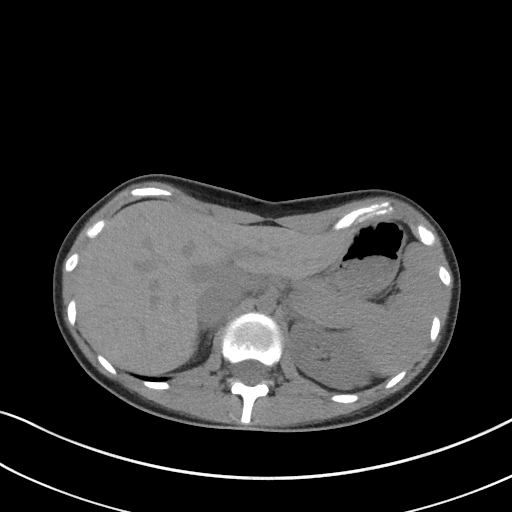
[im 75/86  soft-tissue]
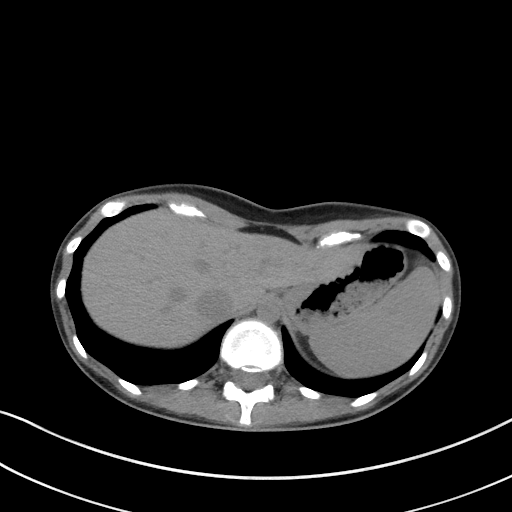
[im 82/86  soft-tissue]
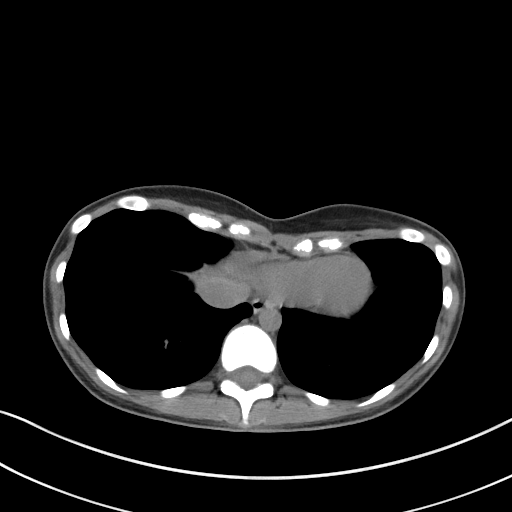

[Series 5: coronal st · coronal · 0.69mm/px · 3 of 74 slices shown]
[im 25/74  soft-tissue]
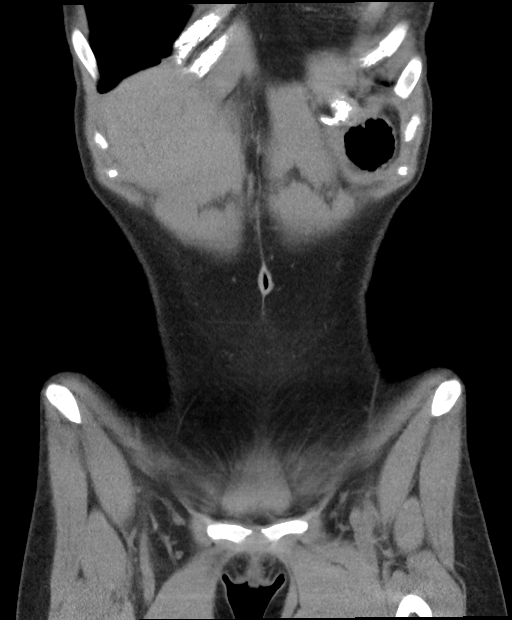
[im 33/74  soft-tissue]
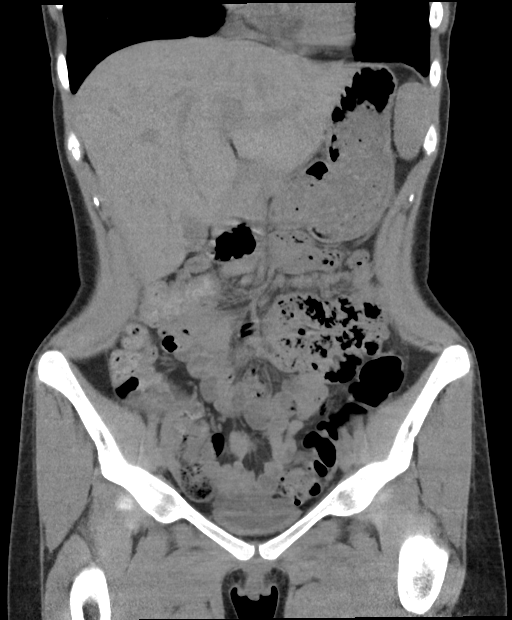
[im 41/74  soft-tissue]
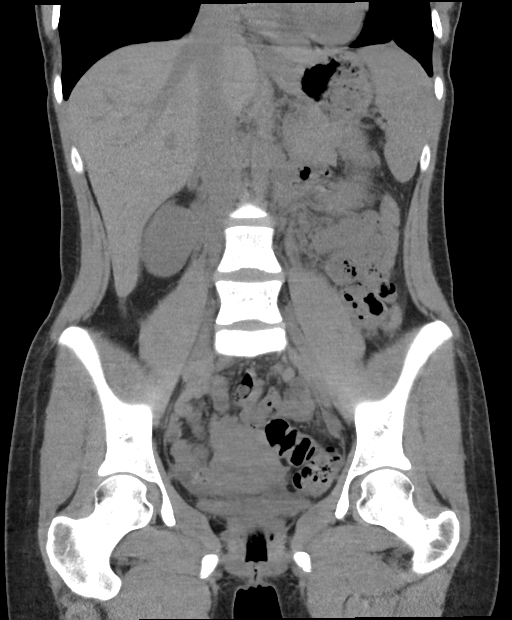

[16 of 46 positions shown; findings below may reference images not displayed]

FINDINGS: Lower chest: No acute abnormality.

Hepatobiliary: No focal liver abnormality is seen. No gallstones,
gallbladder wall thickening, or biliary dilatation.

Pancreas: Unremarkable. No pancreatic ductal dilatation or
surrounding inflammatory changes.

Spleen: Normal in size without focal abnormality.

Adrenals/Urinary Tract: Adrenal glands are unremarkable. Kidneys are
normal, without renal calculi, focal lesion, or hydronephrosis. The
urinary bladder is contracted and subsequently limited in
evaluation.

Stomach/Bowel: Stomach is within normal limits. Appendix appears
normal. No evidence of bowel wall thickening, distention, or
inflammatory changes.

Vascular/Lymphatic: No significant vascular findings are present. No
enlarged abdominal or pelvic lymph nodes.

Reproductive: Uterus and bilateral adnexa are unremarkable.

Other: No abdominal wall hernia or abnormality. No abdominopelvic
ascites.

Musculoskeletal: No acute or significant osseous findings.
IMPRESSION: No acute intra-abdominal findings.

## 2022-06-10 ENCOUNTER — Ambulatory Visit: Payer: Managed Care, Other (non HMO) | Admitting: Nurse Practitioner

## 2022-06-21 ENCOUNTER — Encounter: Payer: Self-pay | Admitting: Physician Assistant

## 2022-06-21 ENCOUNTER — Telehealth: Payer: Managed Care, Other (non HMO) | Admitting: Physician Assistant

## 2022-06-21 ENCOUNTER — Ambulatory Visit
Admission: EM | Admit: 2022-06-21 | Discharge: 2022-06-21 | Disposition: A | Discharge status: Home / Self Care | Payer: Managed Care, Other (non HMO) | Attending: Emergency Medicine | Admitting: Emergency Medicine

## 2022-06-21 DIAGNOSIS — Z91199 Patient's noncompliance with other medical treatment and regimen due to unspecified reason: Secondary | ICD-10-CM

## 2022-06-21 DIAGNOSIS — R300 Dysuria: Secondary | ICD-10-CM | POA: Diagnosis not present

## 2022-06-21 DIAGNOSIS — Z113 Encounter for screening for infections with a predominantly sexual mode of transmission: Secondary | ICD-10-CM | POA: Diagnosis not present

## 2022-06-21 DIAGNOSIS — R350 Frequency of micturition: Secondary | ICD-10-CM | POA: Insufficient documentation

## 2022-06-21 DIAGNOSIS — R319 Hematuria, unspecified: Secondary | ICD-10-CM | POA: Insufficient documentation

## 2022-06-21 DIAGNOSIS — Z3202 Encounter for pregnancy test, result negative: Secondary | ICD-10-CM | POA: Diagnosis not present

## 2022-06-21 LAB — POCT URINALYSIS DIP (MANUAL ENTRY)
Bilirubin, UA: NEGATIVE
Glucose, UA: NEGATIVE mg/dL
Ketones, POC UA: NEGATIVE mg/dL
Nitrite, UA: NEGATIVE
Protein Ur, POC: 100 mg/dL — AB
Spec Grav, UA: 1.01 (ref 1.010–1.025)
Urobilinogen, UA: 0.2 E.U./dL
pH, UA: 7 (ref 5.0–8.0)

## 2022-06-21 LAB — POCT URINE PREGNANCY: Preg Test, Ur: NEGATIVE

## 2022-06-21 MED ORDER — CEPHALEXIN 500 MG PO CAPS: 500.0000 mg | ORAL_CAPSULE | Freq: Two times a day (BID) | ORAL | 0 refills | Status: AC

## 2022-06-21 NOTE — Discharge Instructions (Addendum)
Take the antibiotic as directed.  The urine culture is pending.  We will call you if it shows the need to change or discontinue your antibiotic.    Follow up with your primary care provider if your symptoms are not improving.    

## 2022-06-21 NOTE — ED Triage Notes (Signed)
Patient presents to UC for urinary freq hematuria x 2 days. Not taking anything OTC. Also req preg test.

## 2022-06-21 NOTE — Progress Notes (Signed)
Patient was scheduled for virtual urgent care visit. She did not show to appointment despite links sent to her mobile phone. She was sent a mychart message to reschedule.  No charge.  Mackenzie Rice

## 2022-06-21 NOTE — ED Provider Notes (Signed)
Mackenzie Rice    CSN: [id number] Arrival date & time: 06/21/22  1239      History   Chief Complaint Chief Complaint  Patient presents with   Urinary Frequency    UTI peeing blood - Entered by patient   Hematuria    HPI Mackenzie Rice is a 21 y.o. female.  Patient presents with 2 day history of dysuria and urinary frequency.  She had hematuria yesterday but none today.  She also requests pregnancy test and STD testing.  No treatments at home.  No fever, chills, rash, abdominal pain, vaginal discharge, pelvic pain, flank pain, or other symptoms.    The history is provided by the patient and medical records.    Past Medical History:  Diagnosis Date   Ovarian cyst     Patient Active Problem List   Diagnosis Date Noted   Nonintractable episodic headache 11/13/2020   Sore throat 11/13/2020   Migraine with aura 11/13/2020   Strep throat 11/13/2020    History reviewed. No pertinent surgical history.  OB History     Gravida  0   Para  0   Term  0   Preterm  0   AB  0   Living  0      SAB  0   IAB  0   Ectopic  0   Multiple  0   Live Births  0            Home Medications    Prior to Admission medications   Medication Sig Start Date End Date Taking? Authorizing Provider  cephALEXin (KEFLEX) 500 MG capsule Take 1 capsule (500 mg total) by mouth 2 (two) times daily for 5 days. 06/21/22 06/26/22 Yes Mickie Bail, NP  azithromycin (ZITHROMAX) 250 MG tablet Take 2 tabs PO x 1 dose, then 1 tab PO QD x 4 days 04/08/22   Bing Neighbors, FNP  dicyclomine (BENTYL) 20 MG tablet Take 1 tablet (20 mg total) by mouth 3 (three) times daily as needed for spasms. 06/04/21   Shaune Pollack, MD  ibuprofen (ADVIL) 600 MG tablet Take 1 tablet (600 mg total) by mouth every 8 (eight) hours as needed for moderate pain. 06/04/21   Shaune Pollack, MD  norethindrone (AYGESTIN) 5 MG tablet Take 2 tablets (10 mg total) by mouth daily for 10 days. 06/04/21 06/14/21   Doreene Burke, CNM  ondansetron (ZOFRAN ODT) 4 MG disintegrating tablet Take 1 tablet (4 mg total) by mouth every 8 (eight) hours as needed for nausea or vomiting. 06/04/21   Shaune Pollack, MD    Family History Family History  Problem Relation Age of Onset   Appendicitis Father    Cancer Paternal Aunt    Breast cancer Neg Hx    Ovarian cancer Neg Hx    Colon cancer Neg Hx     Social History Social History   Tobacco Use   Smoking status: Never   Smokeless tobacco: Never  Vaping Use   Vaping Use: Never used  Substance Use Topics   Alcohol use: No   Drug use: No     Allergies   Patient has no known allergies.   Review of Systems Review of Systems  Constitutional:  Negative for chills and fever.  Gastrointestinal:  Negative for abdominal pain, nausea and vomiting.  Genitourinary:  Positive for dysuria, frequency and hematuria. Negative for flank pain, pelvic pain and vaginal discharge.  Skin:  Negative for rash.  All other systems reviewed  and are negative.    Physical Exam Triage Vital Signs ED Triage Vitals  Enc Vitals Group     BP      Pulse      Resp      Temp      Temp src      SpO2      Weight      Height      Head Circumference      Peak Flow      Pain Score      Pain Loc      Pain Edu?      Excl. in [place]?    No data found.  Updated Vital Signs BP 116/82   Pulse 84   Temp 98.4 F (36.9 C)   Resp 18   LMP 06/11/2022   SpO2 99%   Visual Acuity Right Eye Distance:   Left Eye Distance:   Bilateral Distance:    Right Eye Near:   Left Eye Near:    Bilateral Near:     Physical Exam Vitals and nursing note reviewed.  Constitutional:      General: She is not in acute distress.    Appearance: Normal appearance. She is well-developed. She is not ill-appearing.  HENT:     Mouth/Throat:     Mouth: Mucous membranes are moist.  Cardiovascular:     Rate and Rhythm: Normal rate and regular rhythm.     Heart sounds: Normal heart sounds.   Pulmonary:     Effort: Pulmonary effort is normal. No respiratory distress.     Breath sounds: Normal breath sounds.  Abdominal:     General: Bowel sounds are normal.     Palpations: Abdomen is soft.     Tenderness: There is no abdominal tenderness. There is no right CVA tenderness, left CVA tenderness, guarding or rebound.  Musculoskeletal:     Cervical back: Neck supple.  Skin:    General: Skin is warm and dry.  Neurological:     Mental Status: She is alert.  Psychiatric:        Mood and Affect: Mood normal.        Behavior: Behavior normal.      UC Treatments / Results  Labs (all labs ordered are listed, but only abnormal results are displayed) Labs Reviewed  POCT URINALYSIS DIP (MANUAL ENTRY) - Abnormal; Notable for the following components:      Result Value   Blood, UA large (*)    Protein Ur, POC =100 (*)    Leukocytes, UA Small (1+) (*)    All other components within normal limits  URINE CULTURE  RPR  HIV ANTIBODY (ROUTINE TESTING W REFLEX)  POCT URINE PREGNANCY  CERVICOVAGINAL ANCILLARY ONLY    EKG   Radiology No results found.  Procedures Procedures (including critical care time)  Medications Ordered in UC Medications - No data to display  Initial Impression / Assessment and Plan / UC Course  I have reviewed the triage vital signs and the nursing notes.  Pertinent labs & imaging results that were available during my care of the patient were reviewed by me and considered in my medical decision making (see chart for details).    Hematuria, urinary frequency, dysuria, STD screening, negative pregnancy test.  Treating with Keflex. Urine culture pending. Discussed with patient that we will call her if the urine culture shows the need to change or discontinue the antibiotic. Patient obtained vaginal self swab for testing.  Discussed that we  will call if test results are positive.  Discussed that she may require treatment at that time.  Discussed that  sexual partner(s) may also require treatment.  Instructed patient to abstain from sexual activity for at least 7 days.  Instructed her to follow-up with her PCP if her symptoms are not improving.  Patient agrees to plan of care.     Final Clinical Impressions(s) / UC Diagnoses   Final diagnoses:  Hematuria, unspecified type  Urinary frequency  Dysuria  Screening for STD (sexually transmitted disease)  Negative pregnancy test     Discharge Instructions      Take the antibiotic as directed.  The urine culture is pending.  We will call you if it shows the need to change or discontinue your antibiotic.    Follow up with your primary care provider if your symptoms are not improving.        ED Prescriptions     Medication Sig Dispense Auth. Provider   cephALEXin (KEFLEX) 500 MG capsule Take 1 capsule (500 mg total) by mouth 2 (two) times daily for 5 days. 10 capsule Mickie Bail, NP      PDMP not reviewed this encounter.   Mickie Bail, NP 06/21/22 1311

## 2022-06-22 LAB — CERVICOVAGINAL ANCILLARY ONLY
Bacterial Vaginitis (gardnerella): POSITIVE — AB
Candida Glabrata: NEGATIVE
Candida Vaginitis: NEGATIVE
Chlamydia: NEGATIVE
Comment: NEGATIVE
Comment: NEGATIVE
Comment: NEGATIVE
Comment: NEGATIVE
Comment: NEGATIVE
Comment: NORMAL
Neisseria Gonorrhea: NEGATIVE
Trichomonas: NEGATIVE

## 2022-06-23 ENCOUNTER — Telehealth (HOSPITAL_COMMUNITY): Payer: Self-pay | Admitting: Emergency Medicine

## 2022-06-23 ENCOUNTER — Telehealth: Payer: Self-pay | Admitting: Urgent Care

## 2022-06-23 DIAGNOSIS — B9689 Other specified bacterial agents as the cause of diseases classified elsewhere: Secondary | ICD-10-CM

## 2022-06-23 DIAGNOSIS — B3731 Acute candidiasis of vulva and vagina: Secondary | ICD-10-CM

## 2022-06-23 LAB — URINE CULTURE: Culture: 40000 — AB

## 2022-06-23 LAB — RPR: RPR Ser Ql: NONREACTIVE

## 2022-06-23 LAB — HIV ANTIBODY (ROUTINE TESTING W REFLEX): HIV Screen 4th Generation wRfx: NONREACTIVE

## 2022-06-23 MED ORDER — FLUCONAZOLE 150 MG PO TABS: 150.0000 mg | ORAL_TABLET | Freq: Once | ORAL | 0 refills | Status: AC

## 2022-06-23 MED ORDER — METRONIDAZOLE 500 MG PO TABS: 500.0000 mg | ORAL_TABLET | Freq: Two times a day (BID) | ORAL | 0 refills | Status: DC

## 2022-06-23 NOTE — Telephone Encounter (Signed)
Patient seen in clinic with positive UA treated with Keflex.  Vaginal swab was obtained and now positive for BV.  Treating with Flagyl per protocol.  Will also prescribe Diflucan given multiple simultaneous antibiotics and providing instructions to take only if she develops new symptoms of vaginal discharge/itching.

## 2022-09-17 ENCOUNTER — Ambulatory Visit
Admission: EM | Admit: 2022-09-17 | Discharge: 2022-09-17 | Disposition: A | Discharge status: Home / Self Care | Payer: Managed Care, Other (non HMO) | Attending: Emergency Medicine | Admitting: Emergency Medicine

## 2022-09-17 DIAGNOSIS — N3001 Acute cystitis with hematuria: Secondary | ICD-10-CM

## 2022-09-17 DIAGNOSIS — J302 Other seasonal allergic rhinitis: Secondary | ICD-10-CM | POA: Diagnosis not present

## 2022-09-17 DIAGNOSIS — J3089 Other allergic rhinitis: Secondary | ICD-10-CM | POA: Diagnosis not present

## 2022-09-17 DIAGNOSIS — N3000 Acute cystitis without hematuria: Secondary | ICD-10-CM

## 2022-09-17 LAB — POCT URINALYSIS DIP (MANUAL ENTRY)
Glucose, UA: NEGATIVE mg/dL
Nitrite, UA: POSITIVE — AB
Protein Ur, POC: >=300 mg/dL — AB
Spec Grav, UA: 1.025 (ref 1.010–1.025)
Urobilinogen, UA: 1 E.U./dL
pH, UA: 6 (ref 5.0–8.0)

## 2022-09-17 LAB — POCT RAPID STREP A (OFFICE): Rapid Strep A Screen: NEGATIVE

## 2022-09-17 LAB — POCT URINE PREGNANCY: Preg Test, Ur: NEGATIVE

## 2022-09-17 MED ORDER — FLUTICASONE PROPIONATE 50 MCG/ACT NA SUSP: 1.0000 | Freq: Every day | NASAL | 2 refills | Status: AC

## 2022-09-17 MED ORDER — CEFTRIAXONE SODIUM 1 G IJ SOLR
1.0000 g | Freq: Once | INTRAMUSCULAR | Status: AC
  Administered 2022-09-17: 1 g via INTRAMUSCULAR

## 2022-09-17 MED ORDER — FEXOFENADINE HCL 180 MG PO TABS: 180.0000 mg | ORAL_TABLET | Freq: Every day | ORAL | 1 refills | Status: AC

## 2022-09-17 MED ORDER — SULFAMETHOXAZOLE-TRIMETHOPRIM 800-160 MG PO TABS: 1.0000 | ORAL_TABLET | Freq: Two times a day (BID) | ORAL | 0 refills | Status: AC

## 2022-09-17 NOTE — Discharge Instructions (Signed)
Your symptoms and my physical exam findings are concerning for exacerbation of your underlying allergies.     Please see the list below for recommended medications, dosages and frequencies to provide relief of current symptoms:     Allegra (fexofenadine): This is an excellent second-generation antihistamine that helps to reduce respiratory inflammatory response to environmental allergens.  This medication is not known to cause daytime sleepiness so it can be taken in the daytime.  If you find that it does make you sleepy, please feel free to take it at bedtime.   Flonase (fluticasone): This is a steroid nasal spray that you use once daily, 1 spray in each nare.  This medication does not work well if you decide to use it only used as you feel you need to, it works best used on a daily basis.  After 3 to 5 days of use, you will notice significant reduction of the inflammation and mucus production that is currently being caused by exposure to allergens, whether seasonal or environmental.  The most common side effect of this medication is nosebleeds.  If you experience a nosebleed, please discontinue use for 1 week, then feel free to resume.  I have provided you with a prescription.     Common causes of urinary tract infections include but are not limited to holding your urine longer than you should, squatting instead of sitting down when urinating, sitting around in wet clothing such as a wet swimsuit or gym clothes too long, not emptying your bladder after having sexual intercourse, wiping from back to front instead of front to back after having a bowel movement.     The urinalysis that we performed in the clinic today was abnormal.     You were advised to begin antibiotics today because your urinalysis is abnormal and you are having active symptoms of an acute lower urinary tract infection also known as cystitis.     You received an injection of a strong antibiotic called ceftriaxone during your today  to help expedite resolution of your urinary tract infection.   Please pick up and begin taking your prescription for Bactrim DS (trimethoprim sulfamethoxazole) as soon as possible.  Please take all doses exactly as prescribed.  You can take this medication with or without food.  This medication is safe to take with your other medications.   If you have not had complete resolution of your symptoms after completing treatment as prescribed, please return to urgent care for repeat evaluation or follow-up with your primary care provider.  Repeat urinalysis and urine culture may be indicated for more directed therapy.   Thank you for visiting urgent care today.  I appreciate the opportunity to participate in your care.
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
## 2022-11-03 ENCOUNTER — Encounter (HOSPITAL_COMMUNITY): Payer: Self-pay

## 2022-11-03 ENCOUNTER — Ambulatory Visit (HOSPITAL_COMMUNITY)
Admission: RE | Admit: 2022-11-03 | Discharge: 2022-11-03 | Disposition: A | Discharge status: Home / Self Care | Payer: Managed Care, Other (non HMO) | Source: Ambulatory Visit | Attending: Family Medicine | Admitting: Family Medicine

## 2022-11-03 VITALS — BP 127/80 | HR 67 | Temp 98.4°F | Resp 14

## 2022-11-03 DIAGNOSIS — Z113 Encounter for screening for infections with a predominantly sexual mode of transmission: Secondary | ICD-10-CM | POA: Diagnosis not present

## 2022-11-03 LAB — HIV ANTIBODY (ROUTINE TESTING W REFLEX): HIV Screen 4th Generation wRfx: NONREACTIVE

## 2022-11-03 NOTE — ED Triage Notes (Addendum)
Patient states she is in a new relationship and wanted to "make sure things were good."  Patient states she did use a Boric acid suppository 2 days ago.  Patient states she does not have symptoms.

## 2022-11-03 NOTE — Discharge Instructions (Addendum)
We have sent testing for sexually transmitted infections. We will notify you of any positive results once they are received. If required, we will prescribe any medications you might need.  Please refrain from all sexual activity for at least the next seven days.  

## 2022-11-04 LAB — RPR: RPR Ser Ql: NONREACTIVE

## 2022-11-04 LAB — CERVICOVAGINAL ANCILLARY ONLY
Bacterial Vaginitis (gardnerella): NEGATIVE
Candida Glabrata: NEGATIVE
Candida Vaginitis: NEGATIVE
Chlamydia: NEGATIVE
Comment: NEGATIVE
Comment: NEGATIVE
Comment: NEGATIVE
Comment: NEGATIVE
Comment: NEGATIVE
Comment: NORMAL
Neisseria Gonorrhea: NEGATIVE
Trichomonas: NEGATIVE

## 2022-11-04 NOTE — ED Provider Notes (Addendum)
[facility]   [id number] 11/03/22 Arrival Time: 1851  ASSESSMENT & PLAN:  1. Screening for STDs (sexually transmitted diseases)    Reviewed: Results for orders placed or performed during the hospital encounter of 11/03/22  HIV Antibody (routine testing w rflx)  Result Value Ref Range   HIV Screen 4th Generation wRfx Non Reactive Non Reactive  RPR  Result Value Ref Range   RPR Ser Ql NON REACTIVE NON REACTIVE      Discharge Instructions      We have sent testing for sexually transmitted infections. We will notify you of any positive results once they are received. If required, we will prescribe any medications you might need.  Please refrain from all sexual activity for at least the next seven days.     Without s/s of PID.  Labs Reviewed  HIV ANTIBODY (ROUTINE TESTING W REFLEX)  RPR  CERVICOVAGINAL ANCILLARY ONLY     Will notify of any positive results. Instructed to refrain from sexual activity for at least seven days.  Reviewed expectations re: course of current medical issues. Questions answered. Outlined signs and symptoms indicating need for more acute intervention. Patient verbalized understanding. After Visit Summary given.   SUBJECTIVE:  Mackenzie Rice is a 22 y.o. female who requests STD testing. New sexual partner. No abdominal or pelvic pain. Normal PO intake wihout n/v. No genital rashes or lesions. History of STI: no Patient's last menstrual period was 10/30/2022.   OBJECTIVE:  Vitals:   11/03/22 1902  BP: 127/80  Pulse: 67  Resp: 14  Temp: 98.4 F (36.9 C)  TempSrc: Oral  SpO2: 100%     General appearance: alert, cooperative, appears stated age and no distress Lungs: unlabored respirations; speaks full sentences without difficulty Back: no CVA tenderness; FROM at waist Abdomen: soft, non-tender GU: deferred Skin: warm and dry Psychological: alert and cooperative; normal mood and affect.  Results for orders placed or  performed during the hospital encounter of 11/03/22  HIV Antibody (routine testing w rflx)  Result Value Ref Range   HIV Screen 4th Generation wRfx Non Reactive Non Reactive  RPR  Result Value Ref Range   RPR Ser Ql NON REACTIVE NON REACTIVE    Labs Reviewed  HIV ANTIBODY (ROUTINE TESTING W REFLEX)  RPR  CERVICOVAGINAL ANCILLARY ONLY    No Known Allergies  Past Medical History:  Diagnosis Date   Ovarian cyst    Family History  Problem Relation Age of Onset   Appendicitis Father    Cancer Paternal Aunt    Breast cancer Neg Hx    Ovarian cancer Neg Hx    Colon cancer Neg Hx    Social History   Socioeconomic History   Marital status: Single    Spouse name: Not on file   Number of children: Not on file   Years of education: Not on file   Highest education level: Not on file  Occupational History   Not on file  Tobacco Use   Smoking status: Never   Smokeless tobacco: Never  Vaping Use   Vaping Use: Never used  Substance and Sexual Activity   Alcohol use: No   Drug use: No   Sexual activity: Yes    Birth control/protection: Condom  Other Topics Concern   Not on file  Social History Narrative   Not on file   Social Determinants of Health   Financial Resource Strain: Not on file  Food Insecurity: Not on file  Transportation Needs: Not on  file  Physical Activity: Not on file  Stress: Not on file  Social Connections: Not on file  Intimate Partner Violence: Not on file           Vanessa Kick, MD 11/04/22 [id number]    Vanessa Kick, MD 11/04/22 [phone]
